# Patient Record
Sex: Male | Born: 1950 | Race: White | Hispanic: No | Marital: Single | Smoking: Current every day smoker
Health system: Southern US, Community
[De-identification: ages and names within clinical notes are randomized; demographics above are authoritative.]

## PROBLEM LIST (undated history)

## (undated) DIAGNOSIS — Z720 Tobacco use: Secondary | ICD-10-CM

## (undated) DIAGNOSIS — I639 Cerebral infarction, unspecified: Secondary | ICD-10-CM

## (undated) DIAGNOSIS — F1010 Alcohol abuse, uncomplicated: Secondary | ICD-10-CM

## (undated) DIAGNOSIS — E785 Hyperlipidemia, unspecified: Secondary | ICD-10-CM

## (undated) DIAGNOSIS — I10 Essential (primary) hypertension: Secondary | ICD-10-CM

## (undated) DIAGNOSIS — K6289 Other specified diseases of anus and rectum: Secondary | ICD-10-CM

## (undated) DIAGNOSIS — N529 Male erectile dysfunction, unspecified: Secondary | ICD-10-CM

## (undated) DIAGNOSIS — D179 Benign lipomatous neoplasm, unspecified: Secondary | ICD-10-CM

## (undated) DIAGNOSIS — N400 Enlarged prostate without lower urinary tract symptoms: Secondary | ICD-10-CM

## (undated) DIAGNOSIS — N342 Other urethritis: Secondary | ICD-10-CM

## (undated) HISTORY — DX: Male erectile dysfunction, unspecified: N52.9

## (undated) HISTORY — DX: Benign lipomatous neoplasm, unspecified: D17.9

## (undated) HISTORY — PX: OTHER SURGICAL HISTORY: SHX169

## (undated) HISTORY — DX: Benign prostatic hyperplasia without lower urinary tract symptoms: N40.0

## (undated) HISTORY — DX: Hyperlipidemia, unspecified: E78.5

## (undated) HISTORY — DX: Other urethritis: N34.2

## (undated) HISTORY — DX: Other specified diseases of anus and rectum: K62.89

## (undated) HISTORY — DX: Essential (primary) hypertension: I10

---

## 2011-12-08 ENCOUNTER — Ambulatory Visit: Payer: Self-pay | Admitting: Emergency Medicine

## 2013-06-24 ENCOUNTER — Emergency Department: Payer: Self-pay | Admitting: Emergency Medicine

## 2014-12-29 ENCOUNTER — Encounter: Payer: Self-pay | Admitting: Obstetrics and Gynecology

## 2014-12-29 ENCOUNTER — Ambulatory Visit (INDEPENDENT_AMBULATORY_CARE_PROVIDER_SITE_OTHER): Payer: BLUE CROSS/BLUE SHIELD | Admitting: Obstetrics and Gynecology

## 2014-12-29 VITALS — BP 108/75 | HR 86 | Resp 18 | Ht 70.0 in | Wt 218.6 lb

## 2014-12-29 DIAGNOSIS — N400 Enlarged prostate without lower urinary tract symptoms: Secondary | ICD-10-CM

## 2014-12-29 DIAGNOSIS — N529 Male erectile dysfunction, unspecified: Secondary | ICD-10-CM

## 2014-12-29 MED ORDER — ALPROSTADIL (VASODILATOR) 10 MCG IC KIT: 10.0000 ug | PACK | INTRACAVERNOUS | Status: DC | PRN

## 2014-12-29 MED ORDER — VARDENAFIL HCL 20 MG PO TABS: 20.0000 mg | ORAL_TABLET | Freq: Every day | ORAL | Status: DC | PRN

## 2014-12-29 NOTE — Progress Notes (Signed)
12/29/2014 3:27 PM   Marc Fischer [birth year] [id number]  Referring provider: No referring provider defined for this encounter.  Chief Complaint  Patient presents with  . Establish Care    HPI: Patient is a 64 year old male with a history of BPH and erectile dysfunction presenting today for his yearly follow-up appointment.  He presents today requesting refills on caverject and Levitra. He denies any penile pain or curvature.  No urinary complaints including dysuria, hesitancy, or hematuria.He reports difficulty achieving erections with all sexual encounters for many years.  Patient reports that his prostate cancer screening is performed by his PCP at [facility].  He denies any cardiac issues. Does not take any nitrate containing medications.  He is an every day smoker.   PMH: Past Medical History  Diagnosis Date  . Rectal pain   . Lipoma   . ED (erectile dysfunction)   . BPH (benign prostatic hyperplasia)   . HTN (hypertension)   . Urethritis   . HLD (hyperlipidemia)     Surgical History: No past surgical history on file.  Home Medications:    Medication List       This list is accurate as of: 12/29/14  3:27 PM.  Always use your most recent med list.               alprostadil 10 MCG injection  Commonly known as:  EDEX  10 mcg by Intracavitary route as needed for erectile dysfunction. use no more than 3 times per week     benazepril-hydrochlorthiazide 20-12.5 MG tablet  Commonly known as:  LOTENSIN HCT  Take 1 tablet by mouth daily.     gabapentin 100 MG capsule  Commonly known as:  NEURONTIN  Take 100 mg by mouth at bedtime.     omeprazole 20 MG capsule  Commonly known as:  PRILOSEC  Take 20 mg by mouth daily.     temazepam 15 MG capsule  Commonly known as:  RESTORIL  Take 15 mg by mouth at bedtime as needed. for sleep     vardenafil 20 MG tablet  Commonly known as:  LEVITRA  Take 1 tablet (20 mg total) by mouth daily as needed for erectile  dysfunction.        Allergies:  Allergies  Allergen Reactions  . Penicillins     Family History: No family history on file.  Social History:  reports that he has been smoking.  He does not have any smokeless tobacco history on file. He reports that he drinks alcohol. His drug history is not on file.  ROS: UROLOGY Frequent Urination?: No Hard to postpone urination?: No Burning/pain with urination?: No Get up at night to urinate?: No Leakage of urine?: No Urine stream starts and stops?: No Trouble starting stream?: No Do you have to strain to urinate?: No Blood in urine?: No Urinary tract infection?: No Sexually transmitted disease?: No Injury to kidneys or bladder?: No Painful intercourse?: No Weak stream?: No Erection problems?: No Penile pain?: No  Gastrointestinal Nausea?: No Vomiting?: No Indigestion/heartburn?: No Diarrhea?: No Constipation?: No  Constitutional Fever: No Night sweats?: No Weight loss?: No Fatigue?: No  Skin Skin rash/lesions?: No Itching?: No  Eyes Blurred vision?: No Double vision?: No  Ears/Nose/Throat Sore throat?: No Sinus problems?: No  Hematologic/Lymphatic Swollen glands?: No Easy bruising?: No  Cardiovascular Leg swelling?: No Chest pain?: No  Respiratory Cough?: No Shortness of breath?: No  Endocrine Excessive thirst?: No  Musculoskeletal Back pain?: No Joint pain?: No  Neurological Headaches?: No Dizziness?: No  Psychologic Depression?: No Anxiety?: No  Physical Exam: BP 108/75 mmHg  Pulse 86  Resp 18  Ht 5\' 10"  (1.778 m)  Wt 218 lb 9.6 oz (99.156 kg)  BMI 31.37 kg/m2  Constitutional:  Alert and oriented, No acute distress. HEENT: [place] AT, moist mucus membranes.  Trachea midline, no masses. Cardiovascular: No clubbing, cyanosis, or edema. Respiratory: Normal respiratory effort, no increased work of breathing. Skin: No rashes, bruises or suspicious lesions. Lymph: No cervical  adenopathy. Neurologic: Grossly intact, no focal deficits, moving all 4 extremities. Psychiatric: Normal mood and affect.  Laboratory Data:   Urinalysis No results found for: COLORURINE, APPEARANCEUR, LABSPEC, PHURINE, GLUCOSEU, HGBUR, BILIRUBINUR, KETONESUR, PROTEINUR, UROBILINOGEN, NITRITE, LEUKOCYTESUR  Pertinent Imaging:   Assessment & Plan:    1. ED- Patient presents for his yearly follow-up today. He states that he has difficulty achieving erections with all sexual encounters for many years now. He has had good success with Levitra and Caverject injections in the past and tolerated the medications well.  Medications refilled today and precautions reviewed.  2. BPH- Patient has a documented history of BPH.  He states that his PCP has been performing his yearly prostate cancer screening.  He denies LUTS.  Will discuss treatment should patient become symptomatic.  There are no diagnoses linked to this encounter.  Return in about 1 year (around 12/29/2015).  These notes generated with voice recognition software. I apologize for typographical errors.  Herbert Moors, [facility] [address] [place],  [phone]

## 2015-04-25 ENCOUNTER — Other Ambulatory Visit: Payer: Self-pay | Admitting: Urology

## 2015-05-03 ENCOUNTER — Telehealth: Payer: Self-pay | Admitting: Obstetrics and Gynecology

## 2015-05-03 NOTE — Telephone Encounter (Signed)
Pt had prescription for injectable erectile disfunction drug.  He was told it was discontinued and wants to know if there is a replacement.  Please give pt a call.

## 2015-05-07 NOTE — Telephone Encounter (Signed)
Pt was wondering if someone could call him back in regards to his ED medication being d/c'd. Pt was told that we would re-route the msg to [facility] for review and that someone would be in touch as soon as possible. Pt gave verbal ok that if he couldn't pick up it would be ok to leave a detailed msg on vm. Please advise.

## 2015-05-07 NOTE — Telephone Encounter (Signed)
Left patient mess to call/SW °

## 2015-05-09 NOTE — Telephone Encounter (Signed)
Pt is wanting to know what other medications could be given in replacement of edex/caverjet. [facility] told pt medication is no longer available.

## 2015-05-10 NOTE — Telephone Encounter (Signed)
Trimix would be an option.  He would have to schedule a test dose.  Is he interested in trying the Trimix?

## 2015-05-10 NOTE — Telephone Encounter (Signed)
[facility]. Pt made aware.

## 2015-05-15 ENCOUNTER — Ambulatory Visit: Payer: Self-pay | Admitting: Urology

## 2015-05-17 ENCOUNTER — Ambulatory Visit (INDEPENDENT_AMBULATORY_CARE_PROVIDER_SITE_OTHER): Payer: BLUE CROSS/BLUE SHIELD | Admitting: Urology

## 2015-05-17 ENCOUNTER — Telehealth: Payer: Self-pay | Admitting: Urology

## 2015-05-17 ENCOUNTER — Encounter: Payer: Self-pay | Admitting: Urology

## 2015-05-17 VITALS — BP 119/79 | HR 76 | Ht 70.0 in | Wt 211.2 lb

## 2015-05-17 DIAGNOSIS — N529 Male erectile dysfunction, unspecified: Secondary | ICD-10-CM | POA: Diagnosis not present

## 2015-05-17 DIAGNOSIS — N400 Enlarged prostate without lower urinary tract symptoms: Secondary | ICD-10-CM | POA: Diagnosis not present

## 2015-05-17 NOTE — Telephone Encounter (Signed)
Please call [facility] and ask them how much Trimix they gave him in the syringe and then give him refills of 5, 1.0 mL syringes with 3 refills.

## 2015-05-17 NOTE — Progress Notes (Signed)
8:53 PM   Marc Fischer 03/23/1950 [id number]  Referring provider: No referring provider defined for this encounter.  Chief Complaint  Patient presents with  . Erectile Dysfunction    Trimix injection training    HPI: Patient is 65 year old Caucasian male with erectile dysfunction who presents today to discuss Trimix injection therapy.  Patient injected the tri-mix prior to his office visit today.  He states he is achieving a good erection.  He states he injected the entire contents of the syringe.  He was unaware of the amount of medication he injected.    He has used other intracavernosal medication in the past and feels comfortable with the tri-mix.  He is not complaining of pain or curvature with erections.  He states his primary care physician follows him with his PSAs and prostate exams.    PMH: Past Medical History  Diagnosis Date  . Rectal pain   . Lipoma   . ED (erectile dysfunction)   . BPH (benign prostatic hyperplasia)   . HTN (hypertension)   . Urethritis   . HLD (hyperlipidemia)     Surgical History: Past Surgical History  Procedure Laterality Date  . None      Home Medications:    Medication List       This list is accurate as of: 05/17/15 11:59 PM.  Always use your most recent med list.               atorvastatin 20 MG tablet  Commonly known as:  LIPITOR  Take 20 mg by mouth daily.     benazepril-hydrochlorthiazide 20-12.5 MG tablet  Commonly known as:  LOTENSIN HCT  Take 1 tablet by mouth daily.     EDEX 10 MCG injection  Generic drug:  alprostadil  USE AS DIRECTED AS NEEDED     gabapentin 100 MG capsule  Commonly known as:  NEURONTIN  Take 100 mg by mouth at bedtime.     metoprolol succinate 50 MG 24 hr tablet  Commonly known as:  TOPROL-XL  Take 50 mg by mouth daily. Take with or immediately following a meal.     NON FORMULARY  Trimix injection     omeprazole 20 MG capsule  Commonly known as:  PRILOSEC  Take 20 mg by  mouth daily.     temazepam 15 MG capsule  Commonly known as:  RESTORIL  Take 15 mg by mouth at bedtime as needed. Reported on 05/17/2015     vardenafil 20 MG tablet  Commonly known as:  LEVITRA  Take 1 tablet (20 mg total) by mouth daily as needed for erectile dysfunction.        Allergies:  Allergies  Allergen Reactions  . Penicillins     Family History: Family History  Problem Relation Age of Onset  . Kidney disease Neg Hx   . Prostate cancer Neg Hx     Social History:  reports that he has been smoking.  He does not have any smokeless tobacco history on file. He reports that he drinks alcohol. He reports that he does not use illicit drugs.  ROS: UROLOGY Frequent Urination?: No Hard to postpone urination?: No Burning/pain with urination?: No Get up at night to urinate?: No Leakage of urine?: No Urine stream starts and stops?: No Trouble starting stream?: No Do you have to strain to urinate?: No Blood in urine?: No Urinary tract infection?: No Sexually transmitted disease?: No Injury to kidneys or bladder?: No Painful intercourse?: No Weak stream?: No  Erection problems?: Yes Penile pain?: No  Gastrointestinal Nausea?: No Vomiting?: No Indigestion/heartburn?: No Diarrhea?: No Constipation?: No  Constitutional Fever: No Night sweats?: No Weight loss?: No Fatigue?: No  Skin Skin rash/lesions?: No Itching?: No  Eyes Blurred vision?: No Double vision?: No  Ears/Nose/Throat Sore throat?: No Sinus problems?: No  Hematologic/Lymphatic Swollen glands?: No Easy bruising?: No  Cardiovascular Leg swelling?: No Chest pain?: No  Respiratory Cough?: No Shortness of breath?: No  Endocrine Excessive thirst?: No  Musculoskeletal Back pain?: No Joint pain?: No  Neurological Headaches?: No Dizziness?: No  Psychologic Depression?: No Anxiety?: No  Physical Exam: BP 119/79 mmHg  Pulse 76  Ht 5\' 10"  (1.778 m)  Wt 211 lb 3.2 oz (95.8 kg)   BMI 30.30 kg/m2  Constitutional:  Alert and oriented, No acute distress. HEENT: [place] AT, moist mucus membranes.  Trachea midline, no masses. Cardiovascular: No clubbing, cyanosis, or edema. Respiratory: Normal respiratory effort, no increased work of breathing. Skin: No rashes, bruises or suspicious lesions. Lymph: No cervical adenopathy. Neurologic: Grossly intact, no focal deficits, moving all 4 extremities. Psychiatric: Normal mood and affect.   Assessment & Plan:    1. ED- patient is comfortable with the tri-mix injections  I reminded him of the symptoms of priapism and he is to contact the office if he experiences erection greater than 4 hours or pain with the erections.   2. BPH- Patient has a documented history of BPH.  He states that his PCP has been performing his yearly prostate cancer screening.  He denies LUTS.  Will discuss treatment should patient become symptomatic.  Return in about 8 months (around 01/17/2016) for IPSS, SHIM and exam.  These notes generated with voice recognition software. I apologize for typographical errors.  Zara Council, [facility] [address] [place], [place] [address] [phone]

## 2015-05-21 NOTE — Telephone Encounter (Signed)
Medication called into [facility].  

## 2016-01-17 ENCOUNTER — Encounter: Payer: BLUE CROSS/BLUE SHIELD | Admitting: Urology

## 2016-01-17 ENCOUNTER — Telehealth: Payer: Self-pay | Admitting: Urology

## 2016-01-17 ENCOUNTER — Encounter: Payer: Self-pay | Admitting: Urology

## 2016-01-17 ENCOUNTER — Ambulatory Visit (INDEPENDENT_AMBULATORY_CARE_PROVIDER_SITE_OTHER): Payer: BLUE CROSS/BLUE SHIELD | Admitting: Urology

## 2016-01-17 VITALS — BP 122/79 | HR 80 | Ht 70.0 in | Wt 209.0 lb

## 2016-01-17 VITALS — BP 122/79 | HR 80 | Ht 70.0 in | Wt 209.3 lb

## 2016-01-17 DIAGNOSIS — N529 Male erectile dysfunction, unspecified: Secondary | ICD-10-CM | POA: Diagnosis not present

## 2016-01-17 DIAGNOSIS — N400 Enlarged prostate without lower urinary tract symptoms: Secondary | ICD-10-CM | POA: Diagnosis not present

## 2016-01-17 NOTE — Telephone Encounter (Signed)
Would you please call in Trimix (30 mg PAPA, 1 mL phen, 50 mg prostaglandin) for the patient to [facility]?

## 2016-01-17 NOTE — Progress Notes (Signed)
11:28 AM   Marc Fischer 1950/09/10 [id number]  Referring provider: Jodi Marble, MD [address] [place], [place] [address]  Chief Complaint  Patient presents with  . Benign Prostatic Hypertrophy    HPI: Patient is 65 year old Caucasian male with erectile dysfunction and BPH with LUTS who presents today  for a one year follow up.    Erectile dysfunction His SHIM score is 5, which is severe.   He has been having difficulty with erections for several years.   His major complaint is lack of firmness in his erections.  His libido is preserved.   His risk factors for ED are age, BPH, HTN, alcohol abuse and blood pressure medications.   He denies any painful erections or curvatures with his erections.   He is currently using Trimix, but he feels it is not as effective as it has been in the past.       [place] Name 01/17/16 1033 01/17/16 1101       SHIM: Over the last 6 months:   How do you rate your confidence that you could get and keep an erection? Very Low Very Low    When you had erections with sexual stimulation, how often were your erections hard enough for penetration (entering your partner)? Almost Never or Never Almost Never or Never    During sexual intercourse, how often were you able to maintain your erection after you had penetrated (entered) your partner? Extremely Difficult Extremely Difficult    During sexual intercourse, how difficult was it to maintain your erection to completion of intercourse? Extremely Difficult Extremely Difficult    When you attempted sexual intercourse, how often was it satisfactory for you? Extremely Difficult Extremely Difficult      SHIM Total Score   SHIM 5 5       Score: 1-7 Severe ED 8-11 Moderate ED 12-16 Mild-Moderate ED 17-21 Mild ED 22-25 No ED  BPH His IPSS score today is 0, which is no lower urinary tract symptomatology.  He is delighted with his quality life due to his urinary symptoms.  His has no complaints  today.  He denies any dysuria, hematuria or suprapubic pain.   He also denies any recent fevers, chills, nausea or vomiting.   He does not have a family history of PCa.     IPSS    Row Name 01/17/16 1000 01/17/16 1100       International Prostate Symptom Score   How often have you had the sensation of not emptying your bladder? Not at All Not at All    How often have you had to urinate less than every two hours? Not at All Not at All    How often have you found you stopped and started again several times when you urinated? Not at All Not at All    How often have you found it difficult to postpone urination? Not at All Not at All    How often have you had a weak urinary stream? Not at All Not at All    How often have you had to strain to start urination? Not at All Not at All    How many times did you typically get up at night to urinate? None None    Total IPSS Score 0 0      Quality of Life due to urinary symptoms   If you were to spend the rest of your life with your urinary condition just the  way it is now how would you feel about that? Delighted Delighted       Score:  1-7 Mild 8-19 Moderate 20-35 Severe   PMH: Past Medical History:  Diagnosis Date  . BPH (benign prostatic hyperplasia)   . ED (erectile dysfunction)   . HLD (hyperlipidemia)   . HTN (hypertension)   . Lipoma   . Rectal pain   . Urethritis     Surgical History: Past Surgical History:  Procedure Laterality Date  . none      Home Medications:    Medication List       Accurate as of 01/17/16 11:28 AM. Always use your most recent med list.          atorvastatin 20 MG tablet Commonly known as:  LIPITOR Take 20 mg by mouth daily.   benazepril-hydrochlorthiazide 20-12.5 MG tablet Commonly known as:  LOTENSIN HCT Take 1 tablet by mouth daily.   EDEX 10 MCG injection Generic drug:  alprostadil USE AS DIRECTED AS NEEDED   gabapentin 100 MG capsule Commonly known as:  NEURONTIN Take 100 mg  by mouth at bedtime.   metoprolol succinate 50 MG 24 hr tablet Commonly known as:  TOPROL-XL Take 50 mg by mouth daily. Take with or immediately following a meal.   NON FORMULARY Trimix injection   omeprazole 20 MG capsule Commonly known as:  PRILOSEC Take 20 mg by mouth daily.   temazepam 15 MG capsule Commonly known as:  RESTORIL Take 15 mg by mouth at bedtime as needed. Reported on 05/17/2015   vardenafil 20 MG tablet Commonly known as:  LEVITRA Take 1 tablet (20 mg total) by mouth daily as needed for erectile dysfunction.       Allergies:  Allergies  Allergen Reactions  . Penicillins     Family History: Family History  Problem Relation Age of Onset  . Kidney disease Neg Hx   . Prostate cancer Neg Hx     Social History:  reports that he has been smoking.  He has never used smokeless tobacco. He reports that he drinks alcohol. He reports that he does not use drugs.  ROS: UROLOGY Frequent Urination?: No Hard to postpone urination?: No Burning/pain with urination?: No Get up at night to urinate?: No Leakage of urine?: No Urine stream starts and stops?: No Trouble starting stream?: No Do you have to strain to urinate?: No Blood in urine?: No Urinary tract infection?: No Sexually transmitted disease?: No Injury to kidneys or bladder?: No Painful intercourse?: No Weak stream?: No Erection problems?: No Penile pain?: No  Gastrointestinal Nausea?: No Vomiting?: No Indigestion/heartburn?: No Diarrhea?: No Constipation?: No  Constitutional Fever: No Night sweats?: No Weight loss?: No Fatigue?: No  Skin Skin rash/lesions?: No Itching?: No  Eyes Blurred vision?: No Double vision?: No  Ears/Nose/Throat Sore throat?: No Sinus problems?: No  Hematologic/Lymphatic Swollen glands?: No Easy bruising?: No  Cardiovascular Leg swelling?: No Chest pain?: No  Respiratory Cough?: No Shortness of breath?: No  Endocrine Excessive thirst?:  No  Musculoskeletal Back pain?: No Joint pain?: No  Neurological Headaches?: No Dizziness?: No  Psychologic Depression?: No Anxiety?: No  Physical Exam: BP 122/79   Pulse 80   Ht 5\' 10"  (1.778 m)   Wt 209 lb (94.8 kg)   BMI 29.99 kg/m   Constitutional: Well nourished. Alert and oriented, No acute distress. HEENT: [place] AT, moist mucus membranes. Trachea midline, no masses. Cardiovascular: No clubbing, cyanosis, or edema. Respiratory: Normal respiratory effort, no increased work  of breathing. GI: Abdomen is soft, non tender, non distended, no abdominal masses. Liver and spleen not palpable.  No hernias appreciated.  Stool sample for occult testing is not indicated.   GU: No CVA tenderness.  No bladder fullness or masses.  Patient with circumcised phallus.  Urethral meatus is patent.  No penile discharge. No penile lesions or rashes. Scrotum without lesions, cysts, rashes and/or edema.  Testicles are located scrotally bilaterally. No masses are appreciated in the testicles. Left and right epididymis are normal. Rectal: Patient with  normal sphincter tone. Anus and perineum without scarring or rashes. No rectal masses are appreciated. Prostate is approximately 55 grams, no nodules are appreciated. Seminal vesicles are normal. Skin: No rashes, bruises or suspicious lesions. Lymph: No cervical or inguinal adenopathy. Neurologic: Grossly intact, no focal deficits, moving all 4 extremities. Psychiatric: Normal mood and affect.   Assessment & Plan:    1. ED- patient is comfortable with the tri-mix injections  We will increase his dose.  I reminded him of the symptoms of priapism and he is to contact the office if he experiences erection greater than 4 hours or pain with the erections.   RTC in one year for SHIM and exam  2. BPH- IPSS 0/0.   Will discuss treatment should patient become symptomatic.  PSA drawn today.  He will RTC in one year of IPSS, PSA and exam.    Return in about 1  year (around 01/16/2017) for IPSS, SHIM, PSA and exam.  These notes generated with voice recognition software. I apologize for typographical errors.  Zara Council, [facility] [address], [place], [place] [address] [phone]

## 2016-01-17 NOTE — Telephone Encounter (Signed)
[facility].

## 2016-01-18 LAB — PSA: PROSTATE SPECIFIC AG, SERUM: 1.9 ng/mL (ref 0.0–4.0)

## 2016-01-21 ENCOUNTER — Telehealth: Payer: Self-pay

## 2016-01-21 NOTE — Telephone Encounter (Signed)
Spoke with pt in reference to PSA results. Pt voiced understanding.  

## 2016-01-21 NOTE — Telephone Encounter (Signed)
-----   Message from Nori Riis, PA-C sent at 01/18/2016  8:08 AM EST ----- Please notify the patient that his PSA is normal and we will see him next year.

## 2016-01-28 ENCOUNTER — Ambulatory Visit: Payer: BLUE CROSS/BLUE SHIELD | Admitting: Urology

## 2016-04-03 ENCOUNTER — Other Ambulatory Visit: Payer: Self-pay

## 2016-05-21 NOTE — Progress Notes (Signed)
This encounter was created in error - please disregard.

## 2017-01-14 NOTE — Progress Notes (Signed)
10:15 AM   Marc Fischer [birth year] [id number]  Referring provider: Jodi Marble, MD [place], [place] [address]  Chief Complaint  Patient presents with  . Erectile Dysfunction    HPI: Patient is 66 year old Caucasian male with erectile dysfunction and BPH with LUTS who presents today  for a one year follow up.    Erectile dysfunction His SHIM score is 5, which is severe ED.  His previous SHIM score was 5.   He has been having difficulty with erections for several years.   His major complaint is lack of firmness in his erections.  His libido is preserved.   His risk factors for ED are age, BPH, HTN, alcohol abuse and blood pressure medications.   He denies any painful erections or curvatures with his erections.   He is currently using Trimix, but he feels it is not as effective as it has been in the past.   [place] Name 01/15/17 0950         SHIM: Over the last 6 months:   How do you rate your confidence that you could get and keep an erection?  Very Low     When you had erections with sexual stimulation, how often were your erections hard enough for penetration (entering your partner)?  No Sexual Activity     During sexual intercourse, how often were you able to maintain your erection after you had penetrated (entered) your partner?  No Sexual Activity     During sexual intercourse, how difficult was it to maintain your erection to completion of intercourse?  Did not attempt intercourse     When you attempted sexual intercourse, how often was it satisfactory for you?  Did not attempt intercourse       SHIM Total Score   SHIM  1        Score: 1-7 Severe ED 8-11 Moderate ED 12-16 Mild-Moderate ED 17-21 Mild ED 22-25 No ED  BPH His IPSS score today is 0, which is no lower urinary tract symptomatology.  He is delighted with his quality life due to his urinary symptoms.  His I PSS score was 0/0.  His has no complaints today.  He denies any dysuria,  hematuria or suprapubic pain.   He also denies any recent fevers, chills, nausea or vomiting.   He does not have a family history of PCa. IPSS    [place] Name 01/15/17 0900         International Prostate Symptom Score   How often have you had the sensation of not emptying your bladder?  Not at All     How often have you had to urinate less than every two hours?  Not at All     How often have you found you stopped and started again several times when you urinated?  Not at All     How often have you found it difficult to postpone urination?  Not at All     How often have you had a weak urinary stream?  Not at All     How often have you had to strain to start urination?  Not at All     How many times did you typically get up at night to urinate?  None     Total IPSS Score  0       Quality of Life due to urinary symptoms   If you were to spend the rest of your life with  your urinary condition just the way it is now how would you feel about that?  Delighted        Score:  1-7 Mild 8-19 Moderate 20-35 Severe   PMH: Past Medical History:  Diagnosis Date  . BPH (benign prostatic hyperplasia)   . ED (erectile dysfunction)   . HLD (hyperlipidemia)   . HTN (hypertension)   . Lipoma   . Rectal pain   . Urethritis     Surgical History: Past Surgical History:  Procedure Laterality Date  . none      Home Medications:  Allergies as of 01/15/2017      Reactions   Penicillins       Medication List        Accurate as of 01/15/17 10:15 AM. Always use your most recent med list.          atorvastatin 20 MG tablet Commonly known as:  LIPITOR Take 20 mg by mouth daily.   EDEX 10 MCG injection Generic drug:  alprostadil USE AS DIRECTED AS NEEDED   gabapentin 100 MG capsule Commonly known as:  NEURONTIN Take 100 mg by mouth at bedtime.   lisinopril 20 MG tablet Commonly known as:  PRINIVIL,ZESTRIL Take 20 mg by mouth daily.   metoprolol succinate 50 MG 24 hr  tablet Commonly known as:  TOPROL-XL Take 50 mg by mouth daily. Take with or immediately following a meal.   NON FORMULARY Trimix injection   omeprazole 20 MG capsule Commonly known as:  PRILOSEC Take 20 mg by mouth daily.   temazepam 15 MG capsule Commonly known as:  RESTORIL Take 15 mg by mouth at bedtime as needed. Reported on 05/17/2015   vardenafil 20 MG tablet Commonly known as:  LEVITRA Take 1 tablet (20 mg total) by mouth daily as needed for erectile dysfunction.       Allergies:  Allergies  Allergen Reactions  . Penicillins     Family History: Family History  Problem Relation Age of Onset  . Kidney disease Neg Hx   . Prostate cancer Neg Hx     Social History:  reports that he has been smoking.  he has never used smokeless tobacco. He reports that he drinks alcohol. He reports that he does not use drugs.  ROS: UROLOGY Frequent Urination?: No Hard to postpone urination?: No Burning/pain with urination?: No Get up at night to urinate?: No Leakage of urine?: No Urine stream starts and stops?: No Trouble starting stream?: No Do you have to strain to urinate?: No Blood in urine?: No Urinary tract infection?: No Sexually transmitted disease?: No Injury to kidneys or bladder?: No Painful intercourse?: No Weak stream?: No Erection problems?: No Penile pain?: No  Gastrointestinal Nausea?: No Vomiting?: No Indigestion/heartburn?: No Diarrhea?: No Constipation?: No  Constitutional Fever: No Night sweats?: No Weight loss?: No Fatigue?: No  Skin Skin rash/lesions?: No Itching?: No  Eyes Blurred vision?: No Double vision?: No  Ears/Nose/Throat Sore throat?: No Sinus problems?: No  Hematologic/Lymphatic Swollen glands?: No Easy bruising?: No  Cardiovascular Leg swelling?: No Chest pain?: No  Respiratory Cough?: No Shortness of breath?: No  Endocrine Excessive thirst?: No  Musculoskeletal Back pain?: No Joint pain?:  No  Neurological Headaches?: No Dizziness?: No  Psychologic Depression?: No Anxiety?: No  Physical Exam: BP 133/82   Pulse 91   Ht 5\' 10"  (1.778 m)   Wt 201 lb 8 oz (91.4 kg)   BMI 28.91 kg/m   Constitutional: Well nourished. Alert and oriented, No  acute distress. HEENT: [place] AT, moist mucus membranes. Trachea midline, no masses. Cardiovascular: No clubbing, cyanosis, or edema. Respiratory: Normal respiratory effort, no increased work of breathing. GI: Abdomen is soft, non tender, non distended, no abdominal masses. Liver and spleen not palpable.  No hernias appreciated.  Stool sample for occult testing is not indicated.   GU: No CVA tenderness.  No bladder fullness or masses.  He refused his genital exam.   Rectal: He refused rectal exam.   Skin: No rashes, bruises or suspicious lesions. Lymph: No cervical or inguinal adenopathy. Neurologic: Grossly intact, no focal deficits, moving all 4 extremities. Psychiatric: Normal mood and affect.   Assessment & Plan:    1. ED- patient is comfortable with the tri-mix injections  We will increase his dose.  I reminded him of the symptoms of priapism and he is to contact the office if he experiences erection greater than 4 hours or pain with the erections.   RTC in one year for SHIM and exam  2. BPH- IPSS 0/0.   Will discuss treatment should patient become symptomatic.  PSA drawn today.  He will RTC in one year of IPSS, PSA and exam.    Return in about 1 year (around 01/15/2018) for IPSS, SHIM, PSA and exam.  These notes generated with voice recognition software. I apologize for typographical errors.  Zara Council, [facility] [address] [place], [place] [address] [phone]

## 2017-01-15 ENCOUNTER — Telehealth: Payer: Self-pay | Admitting: Urology

## 2017-01-15 ENCOUNTER — Ambulatory Visit (INDEPENDENT_AMBULATORY_CARE_PROVIDER_SITE_OTHER): Payer: Medicare Other | Admitting: Urology

## 2017-01-15 ENCOUNTER — Encounter: Payer: Self-pay | Admitting: Urology

## 2017-01-15 VITALS — BP 133/82 | HR 91 | Ht 70.0 in | Wt 201.5 lb

## 2017-01-15 DIAGNOSIS — N529 Male erectile dysfunction, unspecified: Secondary | ICD-10-CM

## 2017-01-15 DIAGNOSIS — N400 Enlarged prostate without lower urinary tract symptoms: Secondary | ICD-10-CM

## 2017-01-15 NOTE — Telephone Encounter (Signed)
Would you call in the Trimix for the patient?  Would you please call in Trimix (30 mg PAPA, 1 mL phen, 50 mg prostaglandin) for the patient?  Would you ask [facility] if they can change the ingredients?

## 2017-01-16 ENCOUNTER — Telehealth: Payer: Self-pay

## 2017-01-16 LAB — PSA: Prostate Specific Ag, Serum: 1.7 ng/mL (ref 0.0–4.0)

## 2017-01-16 NOTE — Telephone Encounter (Signed)
Will send a letter

## 2017-01-16 NOTE — Telephone Encounter (Signed)
-----   Message from Nori Riis, PA-C sent at 01/16/2017  7:49 AM EST ----- Please let Marc Fischer know that his PSA is stable.

## 2017-02-10 ENCOUNTER — Observation Stay
Admission: EM | Admit: 2017-02-10 | Discharge: 2017-02-10 | Disposition: A | Discharge status: Home / Self Care | Payer: Medicare Other | Attending: Internal Medicine | Admitting: Internal Medicine

## 2017-02-10 ENCOUNTER — Other Ambulatory Visit: Payer: Self-pay

## 2017-02-10 ENCOUNTER — Emergency Department: Payer: Medicare Other

## 2017-02-10 ENCOUNTER — Encounter: Payer: Self-pay | Admitting: Emergency Medicine

## 2017-02-10 DIAGNOSIS — E876 Hypokalemia: Secondary | ICD-10-CM | POA: Insufficient documentation

## 2017-02-10 DIAGNOSIS — F17200 Nicotine dependence, unspecified, uncomplicated: Secondary | ICD-10-CM | POA: Insufficient documentation

## 2017-02-10 DIAGNOSIS — Z880 Allergy status to penicillin: Secondary | ICD-10-CM | POA: Insufficient documentation

## 2017-02-10 DIAGNOSIS — E785 Hyperlipidemia, unspecified: Secondary | ICD-10-CM | POA: Diagnosis not present

## 2017-02-10 DIAGNOSIS — E871 Hypo-osmolality and hyponatremia: Secondary | ICD-10-CM | POA: Insufficient documentation

## 2017-02-10 DIAGNOSIS — N400 Enlarged prostate without lower urinary tract symptoms: Secondary | ICD-10-CM | POA: Insufficient documentation

## 2017-02-10 DIAGNOSIS — Z79899 Other long term (current) drug therapy: Secondary | ICD-10-CM | POA: Diagnosis not present

## 2017-02-10 DIAGNOSIS — F1010 Alcohol abuse, uncomplicated: Secondary | ICD-10-CM | POA: Diagnosis not present

## 2017-02-10 DIAGNOSIS — I10 Essential (primary) hypertension: Secondary | ICD-10-CM | POA: Diagnosis not present

## 2017-02-10 DIAGNOSIS — Z7982 Long term (current) use of aspirin: Secondary | ICD-10-CM | POA: Diagnosis not present

## 2017-02-10 DIAGNOSIS — A419 Sepsis, unspecified organism: Secondary | ICD-10-CM | POA: Diagnosis not present

## 2017-02-10 LAB — COMPREHENSIVE METABOLIC PANEL
ALT: 15 U/L — ABNORMAL LOW (ref 17–63)
ANION GAP: 8 (ref 5–15)
AST: 44 U/L — ABNORMAL HIGH (ref 15–41)
Albumin: 4.2 g/dL (ref 3.5–5.0)
Alkaline Phosphatase: 100 U/L (ref 38–126)
BUN: 10 mg/dL (ref 6–20)
CHLORIDE: 100 mmol/L — AB (ref 101–111)
CO2: 24 mmol/L (ref 22–32)
Calcium: 9.3 mg/dL (ref 8.9–10.3)
Creatinine, Ser: 1.25 mg/dL — ABNORMAL HIGH (ref 0.61–1.24)
GFR, EST NON AFRICAN AMERICAN: 58 mL/min — AB (ref >60)
Glucose, Bld: 140 mg/dL — ABNORMAL HIGH (ref 65–99)
POTASSIUM: 3.2 mmol/L — AB (ref 3.5–5.1)
Sodium: 132 mmol/L — ABNORMAL LOW (ref 135–145)
Total Bilirubin: 0.9 mg/dL (ref 0.3–1.2)
Total Protein: 7.7 g/dL (ref 6.5–8.1)

## 2017-02-10 LAB — CBC WITH DIFFERENTIAL/PLATELET
Basophils Absolute: 0.1 10*3/uL (ref 0–0.1)
Basophils Relative: 1 %
Eosinophils Absolute: 0 10*3/uL (ref 0–0.7)
Eosinophils Relative: 0 %
HCT: 50.4 % (ref 40.0–52.0)
HEMOGLOBIN: 17.5 g/dL (ref 13.0–18.0)
LYMPHS ABS: 0.8 10*3/uL — AB (ref 1.0–3.6)
LYMPHS PCT: 8 %
MCH: 32.8 pg (ref 26.0–34.0)
MCHC: 34.7 g/dL (ref 32.0–36.0)
MCV: 94.6 fL (ref 80.0–100.0)
Monocytes Absolute: 0.8 10*3/uL (ref 0.2–1.0)
Monocytes Relative: 8 %
NEUTROS PCT: 83 %
Neutro Abs: 8.4 10*3/uL — ABNORMAL HIGH (ref 1.4–6.5)
Platelets: 232 10*3/uL (ref 150–440)
RBC: 5.33 MIL/uL (ref 4.40–5.90)
RDW: 13 % (ref 11.5–14.5)
WBC: 10.1 10*3/uL (ref 3.8–10.6)

## 2017-02-10 LAB — URINALYSIS, COMPLETE (UACMP) WITH MICROSCOPIC
BACTERIA UA: NONE SEEN
BILIRUBIN URINE: NEGATIVE
Glucose, UA: NEGATIVE mg/dL
Hgb urine dipstick: NEGATIVE
Ketones, ur: NEGATIVE mg/dL
LEUKOCYTES UA: NEGATIVE
Nitrite: NEGATIVE
PH: 6 (ref 5.0–8.0)
Protein, ur: NEGATIVE mg/dL
RBC / HPF: NONE SEEN RBC/hpf (ref 0–5)
SPECIFIC GRAVITY, URINE: 1.004 — AB (ref 1.005–1.030)
SQUAMOUS EPITHELIAL / LPF: NONE SEEN
WBC, UA: NONE SEEN WBC/hpf (ref 0–5)

## 2017-02-10 LAB — INFLUENZA PANEL BY PCR (TYPE A & B)
INFLAPCR: NEGATIVE
Influenza B By PCR: NEGATIVE

## 2017-02-10 LAB — LACTIC ACID, PLASMA: Lactic Acid, Venous: 1.4 mmol/L (ref 0.5–1.9)

## 2017-02-10 MED ORDER — FOLIC ACID 1 MG PO TABS: 1.0000 mg | ORAL_TABLET | Freq: Every day | ORAL | Status: DC

## 2017-02-10 MED ORDER — VANCOMYCIN HCL IN DEXTROSE 1-5 GM/200ML-% IV SOLN
1000.0000 mg | Freq: Once | INTRAVENOUS | Status: AC
  Administered 2017-02-10: 1000 mg via INTRAVENOUS
  Filled 2017-02-10: qty 200

## 2017-02-10 MED ORDER — LEVOFLOXACIN 750 MG PO TABS: 750.0000 mg | ORAL_TABLET | Freq: Every day | ORAL | 0 refills | Status: AC

## 2017-02-10 MED ORDER — LEVOFLOXACIN IN D5W 750 MG/150ML IV SOLN
750.0000 mg | Freq: Once | INTRAVENOUS | Status: AC
  Administered 2017-02-10: 750 mg via INTRAVENOUS
  Filled 2017-02-10: qty 150

## 2017-02-10 MED ORDER — ADULT MULTIVITAMIN W/MINERALS CH: 1.0000 | ORAL_TABLET | Freq: Every day | ORAL | Status: DC

## 2017-02-10 MED ORDER — THIAMINE HCL 100 MG/ML IJ SOLN: 100.0000 mg | Freq: Every day | INTRAMUSCULAR | Status: DC

## 2017-02-10 MED ORDER — SODIUM CHLORIDE 0.9 % IV BOLUS (SEPSIS)
1000.0000 mL | Freq: Once | INTRAVENOUS | Status: AC
  Administered 2017-02-10: 1000 mL via INTRAVENOUS

## 2017-02-10 MED ORDER — VITAMIN B-1 100 MG PO TABS: 100.0000 mg | ORAL_TABLET | Freq: Every day | ORAL | Status: DC

## 2017-02-10 MED ORDER — LORAZEPAM 1 MG PO TABS: 1.0000 mg | ORAL_TABLET | Freq: Four times a day (QID) | ORAL | Status: DC | PRN

## 2017-02-10 MED ORDER — IBUPROFEN 800 MG PO TABS
ORAL_TABLET | ORAL | Status: AC
  Administered 2017-02-10: 800 mg via ORAL
  Filled 2017-02-10: qty 1

## 2017-02-10 MED ORDER — IBUPROFEN 800 MG PO TABS
800.0000 mg | ORAL_TABLET | Freq: Once | ORAL | Status: AC
  Administered 2017-02-10: 800 mg via ORAL

## 2017-02-10 MED ORDER — POTASSIUM CHLORIDE IN NACL 20-0.9 MEQ/L-% IV SOLN: INTRAVENOUS | Status: DC

## 2017-02-10 MED ORDER — LORAZEPAM 2 MG/ML IJ SOLN: 1.0000 mg | Freq: Four times a day (QID) | INTRAMUSCULAR | Status: DC | PRN

## 2017-02-10 MED ORDER — NICOTINE 7 MG/24HR TD PT24
7.0000 mg | MEDICATED_PATCH | Freq: Every day | TRANSDERMAL | Status: DC
  Filled 2017-02-10: qty 1
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
## 2017-02-10 NOTE — Discharge Instructions (Signed)
It is really very important that she stay in the hospital.  It is possible that if you leave the antibiotics by mouth will not be strong enough you could get sick and die.  Since you are not going to stay please be sure to take the Levaquin 1 pill every day.  Remember if you begin getting pains in your arms and legs come in immediately.  Very rarely the Levaquin will cause a tendon to rupture.  Be sure to return if you feel sicker.  Follow-up with your doctor in the next day or 2.

## 2017-02-10 NOTE — ED Notes (Signed)
Pt reports taking 3-5 200mg  tylenol yesterday and 4-5 200mg  tylenol today.

## 2017-02-10 NOTE — ED Triage Notes (Addendum)
Since last week has cough and congestion.  Says he thinks he has pneumonia.  Has been drinking h2o and mucinex and other otc without relief.  Also under a lot of stress recently.

## 2017-02-10 NOTE — ED Notes (Addendum)
Pt left AMA. He understands what it might mean if he goes home rather than staying here. Pt stated he will come back here "if he needs to." Pt verbalized understanding of when to start taking abx prescription and how often to take it. Pt insisting on leaving AMA. This RN witnessed signature. Dr. Cinda Quest aware. 1C aware they will not be taking this patient again.

## 2017-02-10 NOTE — Progress Notes (Signed)
CODE SEPSIS - PHARMACY COMMUNICATION  **Broad Spectrum Antibiotics should be administered within 1 hour of Sepsis diagnosis**  Time Code Sepsis Called/Page Received: 12/25 1218  Antibiotics Ordered: Levaquin, Vancomycin  Time of 1st antibiotic administration: [id number]  Additional action taken by pharmacy:    If necessary, Name of Provider/Nurse Contacted:      Noralee Space ,PharmD Clinical Pharmacist  02/10/2017  1:03 PM

## 2017-02-10 NOTE — ED Notes (Signed)
Patient transported to XR. 

## 2017-02-10 NOTE — Discharge Summary (Signed)
DOA - 02/10/17 Date left hospital AGAINST MEDICAL ADVICE 12/25/ 2018   HPI  -Marc Fischer  is a 66 y.o. male with a known history of hypertension, hyperlipidemia, alcohol abuse, drinks beer on daily basis. Last drink was last night and other medical problems is presenting to the ED with a chief complaint of chills and cold from last night. Patient was spiking high fever 103 for and heat which made him come to the hospital today. Flu test is negative. Chest x-rays negative. Hospitalist team is called to admit the patient. Patient denies any chest pain or shortness of breath. Denies any abdominal pain. No other complaints. No sick contacts.   # Sepsis-unclear etiology Admitted to [facility] unit Patient met septic criteria with fever and tachycardia-probably viral etiology Blood cultures and urine culture and sensitivity done Flu test is negative Patient is started on empiric IV antibiotics levofloxacin and vancomycin; vancomycin can be discontinued if MRSA PCR test is negative IV fluids  #Hyponatremia and hypokalemia Replete with IV fluids with potassium supplements and recheck in a.m.  #Alcohol abuse CIWA Patient is on detox protocol  #Essential hypertension continue home medication metoprolol and lisinopril  #Hyponatremia continue statin  #Tobacco abuse disorder Consultation to quit smoking for 4-5 minutes. Patient verbalized understanding. Agreeable with nicotine patch.    On the same day as reported by the ED patient left hospital again is medical advice from the emergency department even before he got transferred to 1 c unit of the hospital

## 2017-02-10 NOTE — H&P (Signed)
[facility] NAME: Marc Fischer    MR#:  [id number]  DATE OF BIRTH:  [birth year]  DATE OF ADMISSION:  02/10/2017  PRIMARY CARE PHYSICIAN: Jodi Marble, MD   REQUESTING/REFERRING PHYSICIAN: Dr. Rip Harbour  CHIEF COMPLAINT:  Cough and fever  HISTORY OF PRESENT ILLNESS:  Marc Fischer  is a 66 y.o. male with a known history of hypertension, hyperlipidemia, alcohol abuse, drinks beer on daily basis. Last drink was last night and other medical problems is presenting to the ED with a chief complaint of chills and cold from last night. Patient was spiking high fever 103 for and heat which made him come to the hospital today. Flu test is negative. Chest x-rays negative. Hospitalist team is called to admit the patient. Patient denies any chest pain or shortness of breath. Denies any abdominal pain. No other complaints. No sick contacts.  PAST MEDICAL HISTORY:   Past Medical History:  Diagnosis Date  . BPH (benign prostatic hyperplasia)   . ED (erectile dysfunction)   . HLD (hyperlipidemia)   . HTN (hypertension)   . Lipoma   . Rectal pain   . Urethritis     PAST SURGICAL HISTOIRY:   Past Surgical History:  Procedure Laterality Date  . none      SOCIAL HISTORY:   Social History   Tobacco Use  . Smoking status: Heavy Tobacco Smoker  . Smokeless tobacco: Never Used  Substance Use Topics  . Alcohol use: Yes    Alcohol/week: 0.0 oz    FAMILY HISTORY:   Family History  Problem Relation Age of Onset  . Kidney disease Neg Hx   . Prostate cancer Neg Hx     DRUG ALLERGIES:   Allergies  Allergen Reactions  . Penicillins     Has patient had a PCN reaction causing immediate rash, facial/tongue/throat swelling, SOB or lightheadedness with hypotension: No Has patient had a PCN reaction causing severe rash involving mucus membranes or skin necrosis: No Has patient had a PCN reaction that required  hospitalization: No Has patient had a PCN reaction occurring within the last 10 years: No If all of the above answers are "NO", then may proceed with Cephalosporin use.     REVIEW OF SYSTEMS:  CONSTITUTIONAL:has  fever, fatigue or weakness.  EYES: No blurred or double vision.  EARS, NOSE, AND THROAT: No tinnitus or ear pain.  RESPIRATORY:reports  cough,  deniesshortness of breath, wheezing or hemoptysis.  CARDIOVASCULAR: No chest pain, orthopnea, edema.  GASTROINTESTINAL: No nausea, vomiting, diarrhea or abdominal pain.  GENITOURINARY: No dysuria, hematuria.  ENDOCRINE: No polyuria, nocturia,  HEMATOLOGY: No anemia, easy bruising or bleeding SKIN: No rash or lesion. MUSCULOSKELETAL: No joint pain or arthritis.   NEUROLOGIC: No tingling, numbness, weakness.  PSYCHIATRY: No anxiety or depression.   MEDICATIONS AT HOME:   Prior to Admission medications   Medication Sig Start Date End Date Taking? Authorizing Provider  aspirin 325 MG tablet Take 325-650 mg by mouth daily.   Yes [provider]  atorvastatin (LIPITOR) 20 MG tablet Take 20 mg by mouth daily.   Yes [provider]  gabapentin (NEURONTIN) 100 MG capsule Take 100 mg by mouth at bedtime. 10/13/14  Yes [provider]  lisinopril (PRINIVIL,ZESTRIL) 20 MG tablet Take 40 mg by mouth daily.    Yes [provider]  metoprolol succinate (TOPROL-XL) 50 MG 24 hr tablet Take 50 mg by mouth daily. Take with or immediately following  a meal.   Yes [provider]  omeprazole (PRILOSEC) 20 MG capsule Take 20 mg by mouth daily.   Yes [provider]  temazepam (RESTORIL) 15 MG capsule Take 15 mg by mouth at bedtime as needed for sleep.  11/27/14  Yes [provider]  EDEX 10 MCG injection USE AS DIRECTED AS NEEDED Patient not taking: Reported on 01/17/2016 04/30/15   Zara Council A, PA-C  levofloxacin (LEVAQUIN) 750 MG tablet Take 1 tablet (750 mg total) by mouth daily for 7  days. Start this medicine tomorrow 02/11/2017.  You had the first dose IV in the emergency room. 02/10/17 02/17/17  Nena Polio, MD  NON FORMULARY Trimix injection    [provider]  vardenafil (LEVITRA) 20 MG tablet Take 1 tablet (20 mg total) by mouth daily as needed for erectile dysfunction. Patient not taking: Reported on 01/15/2017 12/29/14   Roda Shutters, FNP      VITAL SIGNS:  Blood pressure 122/73, pulse 98, temperature 99.3 F (37.4 C), temperature source Oral, resp. rate 18, height '5\' 11"'$  (1.803 m), weight 92.5 kg (204 lb), SpO2 97 %.  PHYSICAL EXAMINATION:  GENERAL:  66 y.o.-year-old patient lying in the bed with no acute distress.  EYES: Pupils equal, round, reactive to light and accommodation. No scleral icterus. Extraocular muscles intact.  HEENT: Head atraumatic, normocephalic. Oropharynx and nasopharynx clear.  NECK:  Supple, no jugular venous distention. No thyroid enlargement, no tenderness.  LUNGS: Coarse breath sounds bilaterally, no wheezing, rales,rhonchi or crepitation. No use of accessory muscles of respiration.  CARDIOVASCULAR: S1, S2 normal. No murmurs, rubs, or gallops.  ABDOMEN: Soft, nontender, nondistended. Bowel sounds present. No organomegaly or mass.  EXTREMITIES: No pedal edema, cyanosis, or clubbing.  NEUROLOGIC: Cranial nerves II through XII are intact. Muscle strength 5/5 in all extremities. Sensation intact. Gait not checked.  PSYCHIATRIC: The patient is alert and oriented x 3.  SKIN: No obvious rash, lesion, or ulcer.   LABORATORY PANEL:   CBC Recent Labs  Lab 02/10/17 1156  WBC 10.1  HGB 17.5  HCT 50.4  PLT 232   ------------------------------------------------------------------------------------------------------------------  Chemistries  Recent Labs  Lab 02/10/17 1156  NA 132*  K 3.2*  CL 100*  CO2 24  GLUCOSE 140*  BUN 10  CREATININE 1.25*  CALCIUM 9.3  AST 44*  ALT 15*  ALKPHOS 100  BILITOT 0.9    ------------------------------------------------------------------------------------------------------------------  Cardiac Enzymes No results for input(s): TROPONINI in the last 168 hours. ------------------------------------------------------------------------------------------------------------------  RADIOLOGY:  Dg Chest 2 View  Result Date: 02/10/2017 CLINICAL DATA:  Cough, tachycardia EXAM: CHEST  2 VIEW COMPARISON:  None. FINDINGS: Heart and mediastinal contours are within normal limits. No focal opacities or effusions. No acute bony abnormality. Mild hyperinflation. IMPRESSION: Mild hyperinflation.  No active cardiopulmonary disease. Electronically Signed   By: Rolm Baptise M.D.   On: 02/10/2017 12:28    EKG:   Orders placed or performed during the hospital encounter of 02/10/17  . ED EKG  . ED EKG  . ED EKG 12-Lead  . ED EKG 12-Lead  . EKG 12-Lead  . EKG 12-Lead    IMPRESSION AND PLAN:   Marc Fischer  is a 66 y.o. male with a known history of hypertension, hyperlipidemia, alcohol abuse, drinks beer on daily basis. Last drink was last night and other medical problems is presenting to the ED with a chief complaint of chills and cold from last night. Patient was spiking high fever 103 for and heat  which made him come to the hospital today. Flu test is negative. Chest x-rays negative  # Sepsis-unclear etiology Admitted to [facility] unit Patient met septic criteria with fever and tachycardia-probably viral etiology Blood cultures and urine culture and sensitivity done Flu test is negative Patient is started on empiric IV antibiotics levofloxacin and vancomycin; vancomycin can be discontinued if MRSA PCR test is negative IV fluids  #Hyponatremia and hypokalemia Replete with IV fluids with potassium supplements and recheck in a.m.  #Alcohol abuse CIWA Patient is on detox protocol  #Essential hypertension continue home medication metoprolol and  lisinopril  #Hyponatremia continue statin  #Tobacco abuse disorder Consultation to quit smoking for 4-5 minutes. Patient verbalized understanding. Agreeable with nicotine patch.   GI prophylaxis with Protonix, DVT prophylaxis with Lovenox subcutaneous    All the records are reviewed and case discussed with ED provider. Management plans discussed with the patient, family and they are in agreement.  CODE STATUS: fc ,sister HCPOA  TOTAL TIME TAKING CARE OF THIS PATIENT: 43  minutes.   Note: This dictation was prepared with Dragon dictation along with smaller phrase technology. Any transcriptional errors that result from this process are unintentional.  Nicholes Mango M.D on 02/10/2017 at 3:43 PM  Between 7am to 6pm - Pager - [phone]  After 6pm go to [email or web address] - password [email or web address] [facility]  Office  [phone]  CC: Primary care physician; Jodi Marble, MD

## 2017-02-11 LAB — URINE CULTURE: CULTURE: NO GROWTH

## 2017-02-15 LAB — CULTURE, BLOOD (ROUTINE X 2)
CULTURE: NO GROWTH
Culture: NO GROWTH
Special Requests: ADEQUATE

## 2017-03-05 ENCOUNTER — Encounter: Payer: Self-pay | Admitting: Urology

## 2017-03-05 ENCOUNTER — Ambulatory Visit: Payer: Medicare HMO | Admitting: Urology

## 2017-03-05 VITALS — BP 104/71 | HR 76 | Ht 71.0 in | Wt 204.0 lb

## 2017-03-05 DIAGNOSIS — N529 Male erectile dysfunction, unspecified: Secondary | ICD-10-CM

## 2017-03-05 NOTE — Progress Notes (Signed)
03/05/2017 6:49 PM   Marc Fischer [birth year] [id number]  Referring provider: Jodi Marble, MD [place], [place] [address]  Chief Complaint  Patient presents with  . Erectile Dysfunction    HPI: 67 year old male with erectile dysfunction on intracavernosal injections with tri-mix.  He saw Zara Council in November 2018 and stated he did not feel his current dose was as effective as previous.  He was injecting 1 cc of standard tri-mix.  He states when he called to check on the status he was told that a refill was "inappropriate".  On record review messages were sent for his tri-mix to be refilled at a different dose.   PMH: Past Medical History:  Diagnosis Date  . BPH (benign prostatic hyperplasia)   . ED (erectile dysfunction)   . HLD (hyperlipidemia)   . HTN (hypertension)   . Lipoma   . Rectal pain   . Urethritis     Surgical History: Past Surgical History:  Procedure Laterality Date  . none      Home Medications:  Allergies as of 03/05/2017      Reactions   Penicillins    Has patient had a PCN reaction causing immediate rash, facial/tongue/throat swelling, SOB or lightheadedness with hypotension: No Has patient had a PCN reaction causing severe rash involving mucus membranes or skin necrosis: No Has patient had a PCN reaction that required hospitalization: No Has patient had a PCN reaction occurring within the last 10 years: No If all of the above answers are "NO", then may proceed with Cephalosporin use.      Medication List        Accurate as of 03/05/17  6:49 PM. Always use your most recent med list.          aspirin 325 MG tablet Take 325-650 mg by mouth daily.   atorvastatin 20 MG tablet Commonly known as:  LIPITOR Take 20 mg by mouth daily.   EDEX 10 MCG injection Generic drug:  alprostadil USE AS DIRECTED AS NEEDED   gabapentin 100 MG capsule Commonly known as:  NEURONTIN Take 100 mg by mouth at bedtime.     lisinopril 20 MG tablet Commonly known as:  PRINIVIL,ZESTRIL Take 40 mg by mouth daily.   metoprolol succinate 50 MG 24 hr tablet Commonly known as:  TOPROL-XL Take 50 mg by mouth daily. Take with or immediately following a meal.   NON FORMULARY Trimix injection   omeprazole 20 MG capsule Commonly known as:  PRILOSEC Take 20 mg by mouth daily.   temazepam 15 MG capsule Commonly known as:  RESTORIL Take 15 mg by mouth at bedtime as needed for sleep.   vardenafil 20 MG tablet Commonly known as:  LEVITRA Take 1 tablet (20 mg total) by mouth daily as needed for erectile dysfunction.       Allergies:  Allergies  Allergen Reactions  . Penicillins     Has patient had a PCN reaction causing immediate rash, facial/tongue/throat swelling, SOB or lightheadedness with hypotension: No Has patient had a PCN reaction causing severe rash involving mucus membranes or skin necrosis: No Has patient had a PCN reaction that required hospitalization: No Has patient had a PCN reaction occurring within the last 10 years: No If all of the above answers are "NO", then may proceed with Cephalosporin use.     Family History: Family History  Problem Relation Age of Onset  . Kidney disease Neg Hx   . Prostate cancer Neg Hx  Social History:  reports that he has been smoking.  he has never used smokeless tobacco. He reports that he drinks alcohol. He reports that he does not use drugs.  ROS: UROLOGY Frequent Urination?: No Hard to postpone urination?: No Burning/pain with urination?: No Get up at night to urinate?: No Leakage of urine?: No Urine stream starts and stops?: No Trouble starting stream?: No Do you have to strain to urinate?: No Blood in urine?: No Urinary tract infection?: No Sexually transmitted disease?: No Injury to kidneys or bladder?: No Painful intercourse?: No Weak stream?: No Erection problems?: Yes Penile pain?: No  Gastrointestinal Nausea?: No Vomiting?:  No Indigestion/heartburn?: No Diarrhea?: No Constipation?: No  Constitutional Fever: No Night sweats?: No Weight loss?: No Fatigue?: No  Skin Skin rash/lesions?: No Itching?: No  Eyes Blurred vision?: No Double vision?: No  Ears/Nose/Throat Sore throat?: No Sinus problems?: No  Hematologic/Lymphatic Swollen glands?: No Easy bruising?: No  Cardiovascular Leg swelling?: No Chest pain?: No  Respiratory Cough?: No Shortness of breath?: No  Endocrine Excessive thirst?: No  Musculoskeletal Back pain?: No Joint pain?: No  Neurological Headaches?: No Dizziness?: No  Psychologic Depression?: No Anxiety?: No  Physical Exam: BP 104/71   Pulse 76   Ht 5\' 11"  (1.803 m)   Wt 204 lb (92.5 kg)   BMI 28.45 kg/m   Constitutional:  Alert and oriented, No acute distress.   Laboratory Data: Lab Results  Component Value Date   WBC 10.1 02/10/2017   HGB 17.5 02/10/2017   HCT 50.4 02/10/2017   MCV 94.6 02/10/2017   PLT 232 02/10/2017    Lab Results  Component Value Date   CREATININE 1.25 (H) 02/10/2017    Lab Results  Component Value Date   PSA1 1.7 01/15/2017   PSA1 1.9 01/17/2016     Assessment & Plan:   1.  Erectile dysfunction   An order for super tri-mix 30/2/20 will be sent to [facility] will decrease the injection to 0.7 cc.  I again reiterated should he have an erection lasting longer than 4 hours to contact the office or proceed to the emergency department.    Return in about 1 year (around 03/05/2018) for Recheck.  Abbie Sons, [facility] [address], [place], [place] [address] [phone]

## 2018-01-01 ENCOUNTER — Ambulatory Visit: Payer: Medicare HMO | Admitting: Urology

## 2018-01-01 ENCOUNTER — Encounter: Payer: Self-pay | Admitting: Urology

## 2018-01-01 VITALS — BP 154/96 | HR 76 | Ht 70.0 in | Wt 202.0 lb

## 2018-01-01 DIAGNOSIS — N5201 Erectile dysfunction due to arterial insufficiency: Secondary | ICD-10-CM

## 2018-01-01 DIAGNOSIS — N400 Enlarged prostate without lower urinary tract symptoms: Secondary | ICD-10-CM | POA: Diagnosis not present

## 2018-01-03 ENCOUNTER — Encounter: Payer: Self-pay | Admitting: Urology

## 2018-01-03 DIAGNOSIS — N400 Enlarged prostate without lower urinary tract symptoms: Secondary | ICD-10-CM | POA: Insufficient documentation

## 2018-01-03 DIAGNOSIS — N5201 Erectile dysfunction due to arterial insufficiency: Secondary | ICD-10-CM | POA: Insufficient documentation

## 2018-01-03 NOTE — Progress Notes (Signed)
01/01/2018 11:53 AM   Marc Fischer Jul 22, 1950 [id number]  Referring provider: Jodi Marble, MD [place], [place] [address]  Chief Complaint  Patient presents with  . Erectile Dysfunction    HPI: 67 year old male presents for follow-up of erectile dysfunction.  He is requesting refill of his Trimix.  He was last seen January 2019 and the concentration of Trimix was increased to 30/2/20 which he states was effective.  He does not have frequent intercourse and wanted to be able to get a refill when needed.    He denies bothersome lower urinary tract symptoms.  IPSS was 1-2/35.  He has nocturia x1-2 which is not bothersome.  He states his DRE was checked recently by his PCP.  He has not had a PSA since November 2018.   PMH: Past Medical History:  Diagnosis Date  . BPH (benign prostatic hyperplasia)   . ED (erectile dysfunction)   . HLD (hyperlipidemia)   . HTN (hypertension)   . Lipoma   . Rectal pain   . Urethritis     Surgical History: Past Surgical History:  Procedure Laterality Date  . none      Home Medications:  Allergies as of 01/01/2018      Reactions   Penicillins    Has patient had a PCN reaction causing immediate rash, facial/tongue/throat swelling, SOB or lightheadedness with hypotension: No Has patient had a PCN reaction causing severe rash involving mucus membranes or skin necrosis: No Has patient had a PCN reaction that required hospitalization: No Has patient had a PCN reaction occurring within the last 10 years: No If all of the above answers are "NO", then may proceed with Cephalosporin use.      Medication List        Accurate as of 01/01/18 11:59 PM. Always use your most recent med list.          aspirin 325 MG tablet Take 325-650 mg by mouth daily.   atorvastatin 20 MG tablet Commonly known as:  LIPITOR Take 20 mg by mouth daily.   EDEX 10 MCG injection Generic drug:  alprostadil USE AS DIRECTED AS NEEDED     gabapentin 100 MG capsule Commonly known as:  NEURONTIN Take 100 mg by mouth at bedtime.   lisinopril 20 MG tablet Commonly known as:  PRINIVIL,ZESTRIL Take 40 mg by mouth daily.   lisinopril-hydrochlorothiazide 20-12.5 MG tablet Commonly known as:  PRINZIDE,ZESTORETIC TAKE 2 TABLETS BY MOUTH EVERYDAY- PLEASE MAKE APPT FOR FURTHER REFILLS AND HAVE LABS AS WELL   metoprolol succinate 50 MG 24 hr tablet Commonly known as:  TOPROL-XL Take 50 mg by mouth daily. Take with or immediately following a meal.   NON FORMULARY Trimix injection   omeprazole 20 MG capsule Commonly known as:  PRILOSEC Take 20 mg by mouth daily.   temazepam 15 MG capsule Commonly known as:  RESTORIL Take 15 mg by mouth at bedtime as needed for sleep.       Allergies:  Allergies  Allergen Reactions  . Penicillins     Has patient had a PCN reaction causing immediate rash, facial/tongue/throat swelling, SOB or lightheadedness with hypotension: No Has patient had a PCN reaction causing severe rash involving mucus membranes or skin necrosis: No Has patient had a PCN reaction that required hospitalization: No Has patient had a PCN reaction occurring within the last 10 years: No If all of the above answers are "NO", then may proceed with Cephalosporin use.  Family History: Family History  Problem Relation Age of Onset  . Kidney disease Neg Hx   . Prostate cancer Neg Hx     Social History:  reports that he has been smoking. He has never used smokeless tobacco. He reports that he drinks alcohol. He reports that he does not use drugs.  ROS: UROLOGY Frequent Urination?: No Hard to postpone urination?: No Burning/pain with urination?: No Get up at night to urinate?: No Leakage of urine?: No Urine stream starts and stops?: No Trouble starting stream?: No Do you have to strain to urinate?: No Blood in urine?: No Urinary tract infection?: No Sexually transmitted disease?: No Injury to kidneys or  bladder?: No Painful intercourse?: No Weak stream?: No Erection problems?: No Penile pain?: No  Gastrointestinal Nausea?: No Vomiting?: No Indigestion/heartburn?: No Diarrhea?: No Constipation?: No  Constitutional Fever: No Night sweats?: No Weight loss?: No Fatigue?: No  Skin Skin rash/lesions?: No Itching?: No  Eyes Blurred vision?: No Double vision?: No  Ears/Nose/Throat Sore throat?: No Sinus problems?: No  Hematologic/Lymphatic Swollen glands?: No Easy bruising?: No  Cardiovascular Leg swelling?: No Chest pain?: No  Respiratory Cough?: No Shortness of breath?: No  Endocrine Excessive thirst?: No  Musculoskeletal Back pain?: No Joint pain?: No  Neurological Headaches?: No Dizziness?: No  Psychologic Depression?: No Anxiety?: No  Physical Exam: BP (!) 154/96 (BP Location: Left Arm, Patient Position: Sitting, Cuff Size: Normal)   Pulse 76   Ht 5\' 10"  (1.778 m)   Wt 202 lb (91.6 kg)   BMI 28.98 kg/m   Constitutional:  Alert and oriented, No acute distress.     Assessment & Plan:   67 year old male with erectile dysfunction on intracavernosal injections.  He will hold back for refill when needed.  He desires to continue annual PSA testing until age 55.  He states he did not have time today to have blood drawn and will return for a lab visit.   Return in about 1 year (around 01/02/2019) for Recheck.  Abbie Sons, [facility] [address], [place], [place] [address] [phone]

## 2018-01-13 ENCOUNTER — Other Ambulatory Visit: Payer: Medicare HMO

## 2018-01-13 DIAGNOSIS — N400 Enlarged prostate without lower urinary tract symptoms: Secondary | ICD-10-CM

## 2018-01-14 LAB — PSA: Prostate Specific Ag, Serum: 1.3 ng/mL (ref 0.0–4.0)

## 2018-01-18 ENCOUNTER — Ambulatory Visit: Payer: Self-pay | Admitting: Urology

## 2018-01-19 ENCOUNTER — Telehealth: Payer: Self-pay

## 2018-01-19 NOTE — Telephone Encounter (Signed)
Called pt's home and cell numbers, no answer or VM for either. 1st attempt.

## 2018-01-19 NOTE — Telephone Encounter (Signed)
-----   Message from Abbie Sons, MD sent at 01/18/2018  1:16 PM EST ----- PSA stable at 1.3

## 2018-01-20 NOTE — Telephone Encounter (Signed)
Pt informed

## 2018-03-09 ENCOUNTER — Ambulatory Visit: Payer: Self-pay | Admitting: Urology

## 2018-09-07 IMAGING — CR DG CHEST 2V
1 series · 2 of 2 positions shown · non-contrast
Comparison: None.

CLINICAL DATA: Cough, tachycardia

EXAM:
CHEST  2 VIEW

[Series 1: dg chest 2 view · 0.14mm/px · 2 of 2 slices shown]
[im 1/2]
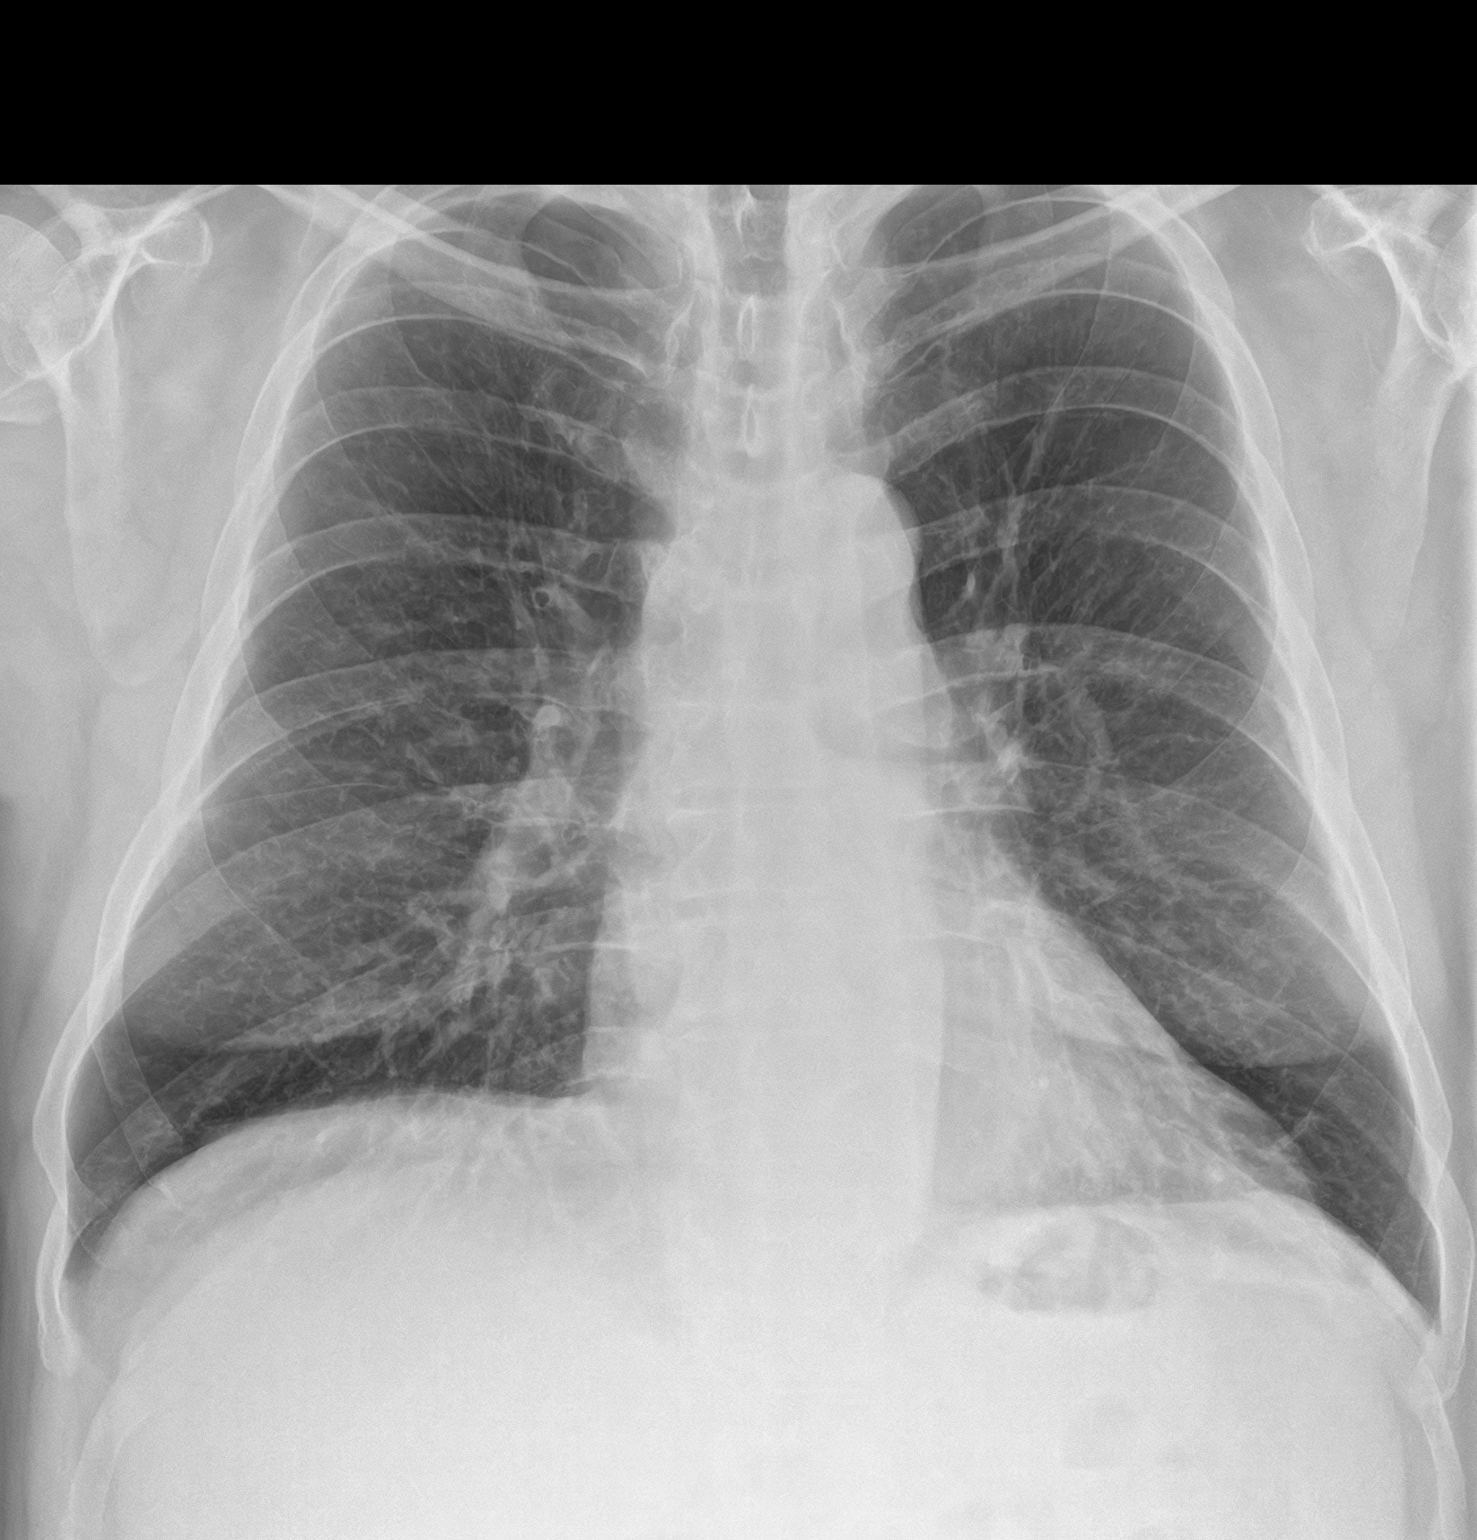
[im 2/2]
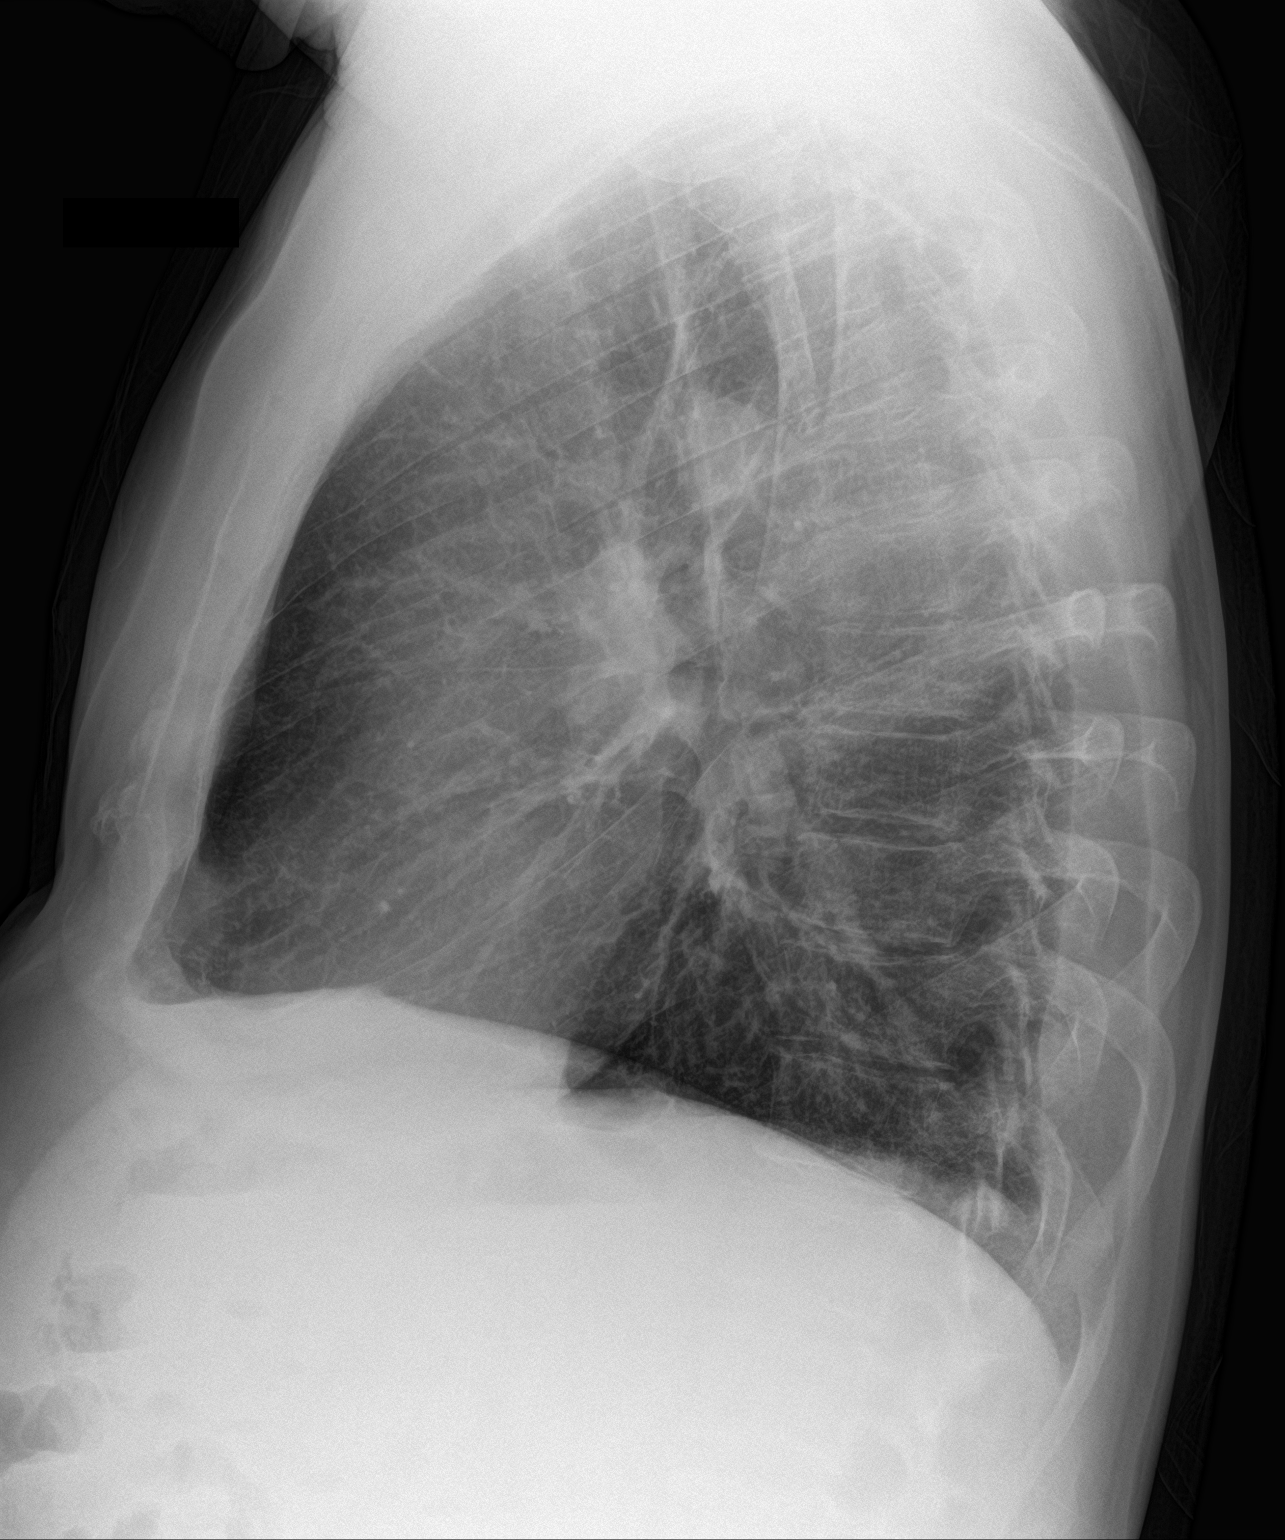

[2 of 2 positions shown; findings below may reference images not displayed]

FINDINGS: Heart and mediastinal contours are within normal limits. No focal
opacities or effusions. No acute bony abnormality. Mild
hyperinflation.
IMPRESSION: Mild hyperinflation.  No active cardiopulmonary disease.

## 2020-02-22 DIAGNOSIS — G4709 Other insomnia: Secondary | ICD-10-CM | POA: Diagnosis not present

## 2020-02-22 DIAGNOSIS — N189 Chronic kidney disease, unspecified: Secondary | ICD-10-CM | POA: Diagnosis not present

## 2020-02-22 DIAGNOSIS — K219 Gastro-esophageal reflux disease without esophagitis: Secondary | ICD-10-CM | POA: Diagnosis not present

## 2020-02-22 DIAGNOSIS — E785 Hyperlipidemia, unspecified: Secondary | ICD-10-CM | POA: Diagnosis not present

## 2020-02-22 DIAGNOSIS — R69 Illness, unspecified: Secondary | ICD-10-CM | POA: Diagnosis not present

## 2020-02-22 DIAGNOSIS — E876 Hypokalemia: Secondary | ICD-10-CM | POA: Diagnosis not present

## 2020-02-22 DIAGNOSIS — I10 Essential (primary) hypertension: Secondary | ICD-10-CM | POA: Diagnosis not present

## 2020-02-22 DIAGNOSIS — I6930 Unspecified sequelae of cerebral infarction: Secondary | ICD-10-CM | POA: Diagnosis not present

## 2020-02-22 DIAGNOSIS — E749 Disorder of carbohydrate metabolism, unspecified: Secondary | ICD-10-CM | POA: Diagnosis not present

## 2020-03-27 DIAGNOSIS — E876 Hypokalemia: Secondary | ICD-10-CM | POA: Diagnosis not present

## 2020-03-27 DIAGNOSIS — E749 Disorder of carbohydrate metabolism, unspecified: Secondary | ICD-10-CM | POA: Diagnosis not present

## 2020-03-27 DIAGNOSIS — N189 Chronic kidney disease, unspecified: Secondary | ICD-10-CM | POA: Diagnosis not present

## 2020-03-27 DIAGNOSIS — R69 Illness, unspecified: Secondary | ICD-10-CM | POA: Diagnosis not present

## 2020-03-27 DIAGNOSIS — E785 Hyperlipidemia, unspecified: Secondary | ICD-10-CM | POA: Diagnosis not present

## 2020-03-27 DIAGNOSIS — G4709 Other insomnia: Secondary | ICD-10-CM | POA: Diagnosis not present

## 2020-03-27 DIAGNOSIS — K219 Gastro-esophageal reflux disease without esophagitis: Secondary | ICD-10-CM | POA: Diagnosis not present

## 2020-03-27 DIAGNOSIS — I6930 Unspecified sequelae of cerebral infarction: Secondary | ICD-10-CM | POA: Diagnosis not present

## 2020-03-27 DIAGNOSIS — I10 Essential (primary) hypertension: Secondary | ICD-10-CM | POA: Diagnosis not present

## 2020-07-31 DIAGNOSIS — E785 Hyperlipidemia, unspecified: Secondary | ICD-10-CM | POA: Diagnosis not present

## 2020-08-15 DIAGNOSIS — N189 Chronic kidney disease, unspecified: Secondary | ICD-10-CM | POA: Diagnosis not present

## 2020-08-15 DIAGNOSIS — R69 Illness, unspecified: Secondary | ICD-10-CM | POA: Diagnosis not present

## 2020-08-15 DIAGNOSIS — I6930 Unspecified sequelae of cerebral infarction: Secondary | ICD-10-CM | POA: Diagnosis not present

## 2020-08-15 DIAGNOSIS — I10 Essential (primary) hypertension: Secondary | ICD-10-CM | POA: Diagnosis not present

## 2020-08-15 DIAGNOSIS — E785 Hyperlipidemia, unspecified: Secondary | ICD-10-CM | POA: Diagnosis not present

## 2020-08-15 DIAGNOSIS — G4709 Other insomnia: Secondary | ICD-10-CM | POA: Diagnosis not present

## 2020-08-15 DIAGNOSIS — K219 Gastro-esophageal reflux disease without esophagitis: Secondary | ICD-10-CM | POA: Diagnosis not present

## 2020-08-15 DIAGNOSIS — E876 Hypokalemia: Secondary | ICD-10-CM | POA: Diagnosis not present

## 2020-08-15 DIAGNOSIS — E749 Disorder of carbohydrate metabolism, unspecified: Secondary | ICD-10-CM | POA: Diagnosis not present

## 2020-11-13 DIAGNOSIS — E876 Hypokalemia: Secondary | ICD-10-CM | POA: Diagnosis not present

## 2020-11-13 DIAGNOSIS — I10 Essential (primary) hypertension: Secondary | ICD-10-CM | POA: Diagnosis not present

## 2020-11-13 DIAGNOSIS — N189 Chronic kidney disease, unspecified: Secondary | ICD-10-CM | POA: Diagnosis not present

## 2020-11-13 DIAGNOSIS — E782 Mixed hyperlipidemia: Secondary | ICD-10-CM | POA: Diagnosis not present

## 2020-11-13 DIAGNOSIS — E785 Hyperlipidemia, unspecified: Secondary | ICD-10-CM | POA: Diagnosis not present

## 2020-11-13 DIAGNOSIS — R7301 Impaired fasting glucose: Secondary | ICD-10-CM | POA: Diagnosis not present

## 2020-11-13 DIAGNOSIS — E669 Obesity, unspecified: Secondary | ICD-10-CM | POA: Diagnosis not present

## 2022-03-17 ENCOUNTER — Emergency Department: Payer: Medicare HMO

## 2022-03-17 ENCOUNTER — Encounter: Payer: Self-pay | Admitting: Emergency Medicine

## 2022-03-17 ENCOUNTER — Other Ambulatory Visit: Payer: Self-pay

## 2022-03-17 ENCOUNTER — Observation Stay
Admission: EM | Admit: 2022-03-17 | Discharge: 2022-03-17 | Discharge status: Against Medical Advice | Payer: Medicare HMO | Attending: Internal Medicine | Admitting: Internal Medicine

## 2022-03-17 DIAGNOSIS — K029 Dental caries, unspecified: Secondary | ICD-10-CM | POA: Diagnosis not present

## 2022-03-17 DIAGNOSIS — I10 Essential (primary) hypertension: Secondary | ICD-10-CM | POA: Insufficient documentation

## 2022-03-17 DIAGNOSIS — H547 Unspecified visual loss: Secondary | ICD-10-CM | POA: Diagnosis not present

## 2022-03-17 DIAGNOSIS — F1910 Other psychoactive substance abuse, uncomplicated: Secondary | ICD-10-CM

## 2022-03-17 DIAGNOSIS — Z79899 Other long term (current) drug therapy: Secondary | ICD-10-CM | POA: Insufficient documentation

## 2022-03-17 DIAGNOSIS — I639 Cerebral infarction, unspecified: Secondary | ICD-10-CM | POA: Diagnosis not present

## 2022-03-17 DIAGNOSIS — F17210 Nicotine dependence, cigarettes, uncomplicated: Secondary | ICD-10-CM | POA: Insufficient documentation

## 2022-03-17 DIAGNOSIS — I6601 Occlusion and stenosis of right middle cerebral artery: Secondary | ICD-10-CM | POA: Diagnosis not present

## 2022-03-17 DIAGNOSIS — Y9355 Activity, bike riding: Secondary | ICD-10-CM | POA: Insufficient documentation

## 2022-03-17 DIAGNOSIS — J3489 Other specified disorders of nose and nasal sinuses: Secondary | ICD-10-CM | POA: Diagnosis not present

## 2022-03-17 DIAGNOSIS — Y92410 Unspecified street and highway as the place of occurrence of the external cause: Secondary | ICD-10-CM | POA: Insufficient documentation

## 2022-03-17 DIAGNOSIS — H538 Other visual disturbances: Secondary | ICD-10-CM | POA: Diagnosis not present

## 2022-03-17 DIAGNOSIS — H53132 Sudden visual loss, left eye: Secondary | ICD-10-CM | POA: Diagnosis not present

## 2022-03-17 DIAGNOSIS — R69 Illness, unspecified: Secondary | ICD-10-CM | POA: Diagnosis not present

## 2022-03-17 DIAGNOSIS — Z7901 Long term (current) use of anticoagulants: Secondary | ICD-10-CM | POA: Insufficient documentation

## 2022-03-17 DIAGNOSIS — S0990XA Unspecified injury of head, initial encounter: Secondary | ICD-10-CM | POA: Diagnosis not present

## 2022-03-17 LAB — CBC WITH DIFFERENTIAL/PLATELET
Abs Immature Granulocytes: 0.03 10*3/uL (ref 0.00–0.07)
Basophils Absolute: 0.1 10*3/uL (ref 0.0–0.1)
Basophils Relative: 1 %
Eosinophils Absolute: 0 10*3/uL (ref 0.0–0.5)
Eosinophils Relative: 0 %
HCT: 46.9 % (ref 39.0–52.0)
Hemoglobin: 16.1 g/dL (ref 13.0–17.0)
Immature Granulocytes: 0 %
Lymphocytes Relative: 18 %
Lymphs Abs: 1.4 10*3/uL (ref 0.7–4.0)
MCH: 32.6 pg (ref 26.0–34.0)
MCHC: 34.3 g/dL (ref 30.0–36.0)
MCV: 94.9 fL (ref 80.0–100.0)
Monocytes Absolute: 0.5 10*3/uL (ref 0.1–1.0)
Monocytes Relative: 7 %
Neutro Abs: 5.8 10*3/uL (ref 1.7–7.7)
Neutrophils Relative %: 74 %
Platelets: 277 10*3/uL (ref 150–400)
RBC: 4.94 MIL/uL (ref 4.22–5.81)
RDW: 12.1 % (ref 11.5–15.5)
WBC: 7.9 10*3/uL (ref 4.0–10.5)
nRBC: 0 % (ref 0.0–0.2)

## 2022-03-17 LAB — SEDIMENTATION RATE: Sed Rate: 8 mm/hr (ref 0–20)

## 2022-03-17 LAB — BASIC METABOLIC PANEL
Anion gap: 11 (ref 5–15)
BUN: 13 mg/dL (ref 8–23)
CO2: 20 mmol/L — ABNORMAL LOW (ref 22–32)
Calcium: 9.4 mg/dL (ref 8.9–10.3)
Chloride: 101 mmol/L (ref 98–111)
Creatinine, Ser: 1.13 mg/dL (ref 0.61–1.24)
GFR, Estimated: >60 mL/min (ref >60)
Glucose, Bld: 120 mg/dL — ABNORMAL HIGH (ref 70–99)
Potassium: 3.4 mmol/L — ABNORMAL LOW (ref 3.5–5.1)
Sodium: 132 mmol/L — ABNORMAL LOW (ref 135–145)

## 2022-03-17 LAB — HEPATIC FUNCTION PANEL
ALT: 19 U/L (ref 0–44)
AST: 30 U/L (ref 15–41)
Albumin: 4.2 g/dL (ref 3.5–5.0)
Alkaline Phosphatase: 74 U/L (ref 38–126)
Bilirubin, Direct: <0.1 mg/dL (ref 0.0–0.2)
Total Bilirubin: 0.7 mg/dL (ref 0.3–1.2)
Total Protein: 7.5 g/dL (ref 6.5–8.1)

## 2022-03-17 LAB — LIPID PANEL
Cholesterol: 276 mg/dL — ABNORMAL HIGH (ref 0–200)
HDL: 53 mg/dL (ref >40)
LDL Cholesterol: 195 mg/dL — ABNORMAL HIGH (ref 0–99)
Total CHOL/HDL Ratio: 5.2 RATIO
Triglycerides: 141 mg/dL (ref <150)
VLDL: 28 mg/dL (ref 0–40)

## 2022-03-17 LAB — MAGNESIUM: Magnesium: 1.8 mg/dL (ref 1.7–2.4)

## 2022-03-17 LAB — PHOSPHORUS: Phosphorus: 2.7 mg/dL (ref 2.5–4.6)

## 2022-03-17 MED ORDER — ACETAMINOPHEN 325 MG PO TABS: 650.0000 mg | ORAL_TABLET | ORAL | Status: DC | PRN

## 2022-03-17 MED ORDER — STROKE: EARLY STAGES OF RECOVERY BOOK: Freq: Once | Status: DC

## 2022-03-17 MED ORDER — LORAZEPAM 1 MG PO TABS: 1.0000 mg | ORAL_TABLET | ORAL | Status: DC | PRN

## 2022-03-17 MED ORDER — ASPIRIN 325 MG PO TABS: 325.0000 mg | ORAL_TABLET | Freq: Once | ORAL | Status: DC

## 2022-03-17 MED ORDER — THIAMINE HCL 100 MG/ML IJ SOLN: 100.0000 mg | Freq: Every day | INTRAMUSCULAR | Status: DC

## 2022-03-17 MED ORDER — ATORVASTATIN CALCIUM 20 MG PO TABS: 80.0000 mg | ORAL_TABLET | Freq: Every day | ORAL | Status: DC

## 2022-03-17 MED ORDER — ACETAMINOPHEN 160 MG/5ML PO SOLN: 650.0000 mg | ORAL | Status: DC | PRN

## 2022-03-17 MED ORDER — IOHEXOL 350 MG/ML SOLN
75.0000 mL | Freq: Once | INTRAVENOUS | Status: AC | PRN
  Administered 2022-03-17: 75 mL via INTRAVENOUS

## 2022-03-17 MED ORDER — ADULT MULTIVITAMIN W/MINERALS CH: 1.0000 | ORAL_TABLET | Freq: Every day | ORAL | Status: DC

## 2022-03-17 MED ORDER — ACETAMINOPHEN 650 MG RE SUPP: 650.0000 mg | RECTAL | Status: DC | PRN

## 2022-03-17 MED ORDER — FOLIC ACID 1 MG PO TABS: 1.0000 mg | ORAL_TABLET | Freq: Every day | ORAL | Status: DC

## 2022-03-17 MED ORDER — LORAZEPAM 2 MG/ML IJ SOLN: 1.0000 mg | INTRAMUSCULAR | Status: DC | PRN

## 2022-03-17 MED ORDER — THIAMINE MONONITRATE 100 MG PO TABS: 100.0000 mg | ORAL_TABLET | Freq: Every day | ORAL | Status: DC

## 2022-03-17 MED ORDER — SENNOSIDES-DOCUSATE SODIUM 8.6-50 MG PO TABS: 1.0000 | ORAL_TABLET | Freq: Every evening | ORAL | Status: DC | PRN

## 2022-03-17 NOTE — Assessment & Plan Note (Addendum)
Patient to olmesartan/amlodipine at home, but frequently misses doses.  - Hold home antihypertensives for now pending MRI of the brain, although the patient is likely out of the window for permissive hypertension
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
## 2022-03-17 NOTE — Assessment & Plan Note (Signed)
Patient endorses daily use of tobacco and alcohol (approximately 80 ounces of beer per day), in addition to occasional cocaine use.  - CIWA with Ativan as needed - Thiamine and folic acid daily - Counseled on the importance of cessation.  Patient open to the idea

## 2022-03-17 NOTE — ED Notes (Signed)
Informed RN bed assigned 
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
## 2022-03-17 NOTE — Progress Notes (Addendum)
Pt arrived to room 103, pt immediately stated ranting that he was leaving that "he was going home to smoke a cigarette and drink a beer" I educated pt on the benefits of staying and that he could get a nicotine patch and something for his anxiety. He refused to stay, called a ride and he signed the [facility] form. IV was removed from R-AC. I paged MD but I was not able to get them up here in time.
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
## 2022-03-17 NOTE — Assessment & Plan Note (Addendum)
Patient is present with nearly 11-day history of left vision loss with CTA demonstrating severe focal stenosis of the left ICA with a soft atherosclerotic plaque.  Vascular surgery has been consulted and will evaluate patient, although patient has stated he did does not want any kind of surgical procedure done.    - Neurology and vascular surgery consulted; appreciate their recommendations - Ophthalmology recommending outpatient follow-up in 1 week - MRI brain pending - Telemetry monitoring - Allow for permissive HTN until MRI of the brain is completed (systolic < 500 and diastolic < 164)  - Echocardiogram  - A1C and Lipid panel  - ASA 325 mg once. Start 81 mg daily tomorrow - Lipitor 80 mg daily   - PT/OT/SLP

## 2022-03-17 NOTE — Plan of Care (Signed)
EDP Dr. Jari Pigg discussed this case with me. Pt had fall one week ago, then left eye vision in and out for the next 2-3 days and now came in with left eye vision loss. CT no acute finding but old left cerebellum infarct. CTA head and neck showed left ICA bulb 80% stenosis, right ICA bulb 40% stenosis.  Left ICA siphon and left vertebral artery moderate to severe stenosis.  Patient symptoms concerning for symptomatic left ICA stenosis and I recommend vascular surgery consultation for left ICA revascularization.  I went to see patient but vascular surgery was in room talking with patient, I decided come back later.  Per note, vascular surgery recommended left carotid angiogram with balloon angioplasty and stent placement.  Patient then was then admitted to floor.  However when I try to see him and noticed that patient had signed AMA.  Rosalin Hawking, MD PhD Stroke Neurology 03/17/2022 7:37 PM

## 2022-03-18 ENCOUNTER — Ambulatory Visit: Admit: 2022-03-18 | Payer: Medicare HMO | Admitting: Vascular Surgery

## 2022-03-18 SURGERY — CAROTID ANGIOGRAPHY
Anesthesia: Moderate Sedation | Laterality: Left

## 2023-03-19 ENCOUNTER — Observation Stay: Payer: Medicare HMO

## 2023-03-19 ENCOUNTER — Emergency Department: Payer: Medicare HMO

## 2023-03-19 ENCOUNTER — Other Ambulatory Visit: Payer: Self-pay

## 2023-03-19 ENCOUNTER — Inpatient Hospital Stay: Payer: Medicare HMO

## 2023-03-19 ENCOUNTER — Inpatient Hospital Stay
Admission: EM | Admit: 2023-03-19 | Discharge: 2023-04-18 | DRG: 064 | Disposition: E | Payer: Medicare HMO | Attending: Student in an Organized Health Care Education/Training Program | Admitting: Student in an Organized Health Care Education/Training Program

## 2023-03-19 ENCOUNTER — Encounter: Payer: Self-pay | Admitting: Internal Medicine

## 2023-03-19 DIAGNOSIS — J9601 Acute respiratory failure with hypoxia: Secondary | ICD-10-CM

## 2023-03-19 DIAGNOSIS — I10 Essential (primary) hypertension: Secondary | ICD-10-CM | POA: Diagnosis present

## 2023-03-19 DIAGNOSIS — E876 Hypokalemia: Secondary | ICD-10-CM | POA: Diagnosis present

## 2023-03-19 DIAGNOSIS — W19XXXA Unspecified fall, initial encounter: Secondary | ICD-10-CM

## 2023-03-19 DIAGNOSIS — R531 Weakness: Secondary | ICD-10-CM | POA: Diagnosis not present

## 2023-03-19 DIAGNOSIS — R4189 Other symptoms and signs involving cognitive functions and awareness: Secondary | ICD-10-CM | POA: Diagnosis not present

## 2023-03-19 DIAGNOSIS — E669 Obesity, unspecified: Secondary | ICD-10-CM | POA: Diagnosis present

## 2023-03-19 DIAGNOSIS — J690 Pneumonitis due to inhalation of food and vomit: Secondary | ICD-10-CM | POA: Diagnosis present

## 2023-03-19 DIAGNOSIS — Y92009 Unspecified place in unspecified non-institutional (private) residence as the place of occurrence of the external cause: Secondary | ICD-10-CM

## 2023-03-19 DIAGNOSIS — R569 Unspecified convulsions: Secondary | ICD-10-CM

## 2023-03-19 DIAGNOSIS — F1010 Alcohol abuse, uncomplicated: Secondary | ICD-10-CM | POA: Diagnosis present

## 2023-03-19 DIAGNOSIS — N179 Acute kidney failure, unspecified: Secondary | ICD-10-CM | POA: Diagnosis present

## 2023-03-19 DIAGNOSIS — R0789 Other chest pain: Secondary | ICD-10-CM

## 2023-03-19 DIAGNOSIS — R578 Other shock: Secondary | ICD-10-CM

## 2023-03-19 DIAGNOSIS — I639 Cerebral infarction, unspecified: Principal | ICD-10-CM | POA: Diagnosis present

## 2023-03-19 DIAGNOSIS — R140 Abdominal distension (gaseous): Secondary | ICD-10-CM | POA: Diagnosis present

## 2023-03-19 DIAGNOSIS — Z789 Other specified health status: Secondary | ICD-10-CM

## 2023-03-19 DIAGNOSIS — E785 Hyperlipidemia, unspecified: Secondary | ICD-10-CM | POA: Diagnosis present

## 2023-03-19 DIAGNOSIS — Z720 Tobacco use: Secondary | ICD-10-CM | POA: Diagnosis present

## 2023-03-19 DIAGNOSIS — D735 Infarction of spleen: Secondary | ICD-10-CM | POA: Diagnosis present

## 2023-03-19 DIAGNOSIS — E872 Acidosis, unspecified: Secondary | ICD-10-CM | POA: Diagnosis present

## 2023-03-19 DIAGNOSIS — Z4682 Encounter for fitting and adjustment of non-vascular catheter: Secondary | ICD-10-CM | POA: Diagnosis not present

## 2023-03-19 DIAGNOSIS — I6501 Occlusion and stenosis of right vertebral artery: Secondary | ICD-10-CM | POA: Diagnosis not present

## 2023-03-19 DIAGNOSIS — F10150 Alcohol abuse with alcohol-induced psychotic disorder with delusions: Secondary | ICD-10-CM | POA: Diagnosis not present

## 2023-03-19 DIAGNOSIS — T794XXA Traumatic shock, initial encounter: Secondary | ICD-10-CM | POA: Diagnosis not present

## 2023-03-19 DIAGNOSIS — I1310 Hypertensive heart and chronic kidney disease without heart failure, with stage 1 through stage 4 chronic kidney disease, or unspecified chronic kidney disease: Secondary | ICD-10-CM | POA: Diagnosis not present

## 2023-03-19 DIAGNOSIS — N170 Acute kidney failure with tubular necrosis: Secondary | ICD-10-CM | POA: Diagnosis present

## 2023-03-19 DIAGNOSIS — K661 Hemoperitoneum: Secondary | ICD-10-CM | POA: Diagnosis present

## 2023-03-19 DIAGNOSIS — R29810 Facial weakness: Secondary | ICD-10-CM | POA: Diagnosis present

## 2023-03-19 DIAGNOSIS — I6389 Other cerebral infarction: Secondary | ICD-10-CM | POA: Diagnosis not present

## 2023-03-19 DIAGNOSIS — F17210 Nicotine dependence, cigarettes, uncomplicated: Secondary | ICD-10-CM | POA: Diagnosis present

## 2023-03-19 DIAGNOSIS — Z515 Encounter for palliative care: Secondary | ICD-10-CM | POA: Diagnosis not present

## 2023-03-19 DIAGNOSIS — J439 Emphysema, unspecified: Secondary | ICD-10-CM | POA: Diagnosis not present

## 2023-03-19 DIAGNOSIS — G9341 Metabolic encephalopathy: Secondary | ICD-10-CM | POA: Diagnosis not present

## 2023-03-19 DIAGNOSIS — F10231 Alcohol dependence with withdrawal delirium: Secondary | ICD-10-CM | POA: Diagnosis present

## 2023-03-19 DIAGNOSIS — Z8249 Family history of ischemic heart disease and other diseases of the circulatory system: Secondary | ICD-10-CM

## 2023-03-19 DIAGNOSIS — I700 Atherosclerosis of aorta: Secondary | ICD-10-CM | POA: Diagnosis not present

## 2023-03-19 DIAGNOSIS — R579 Shock, unspecified: Secondary | ICD-10-CM | POA: Diagnosis not present

## 2023-03-19 DIAGNOSIS — S2242XA Multiple fractures of ribs, left side, initial encounter for closed fracture: Secondary | ICD-10-CM | POA: Diagnosis not present

## 2023-03-19 DIAGNOSIS — S36029A Unspecified contusion of spleen, initial encounter: Secondary | ICD-10-CM | POA: Diagnosis not present

## 2023-03-19 DIAGNOSIS — R4182 Altered mental status, unspecified: Secondary | ICD-10-CM | POA: Diagnosis not present

## 2023-03-19 DIAGNOSIS — G4733 Obstructive sleep apnea (adult) (pediatric): Secondary | ICD-10-CM | POA: Diagnosis present

## 2023-03-19 DIAGNOSIS — Z66 Do not resuscitate: Secondary | ICD-10-CM | POA: Diagnosis not present

## 2023-03-19 DIAGNOSIS — Y901 Blood alcohol level of 20-39 mg/100 ml: Secondary | ICD-10-CM | POA: Diagnosis not present

## 2023-03-19 DIAGNOSIS — M8580 Other specified disorders of bone density and structure, unspecified site: Secondary | ICD-10-CM | POA: Diagnosis not present

## 2023-03-19 DIAGNOSIS — F1410 Cocaine abuse, uncomplicated: Secondary | ICD-10-CM | POA: Diagnosis not present

## 2023-03-19 DIAGNOSIS — I214 Non-ST elevation (NSTEMI) myocardial infarction: Secondary | ICD-10-CM | POA: Diagnosis not present

## 2023-03-19 DIAGNOSIS — R0781 Pleurodynia: Secondary | ICD-10-CM | POA: Diagnosis not present

## 2023-03-19 DIAGNOSIS — T730XXA Starvation, initial encounter: Secondary | ICD-10-CM | POA: Diagnosis present

## 2023-03-19 DIAGNOSIS — F10931 Alcohol use, unspecified with withdrawal delirium: Secondary | ICD-10-CM | POA: Diagnosis not present

## 2023-03-19 DIAGNOSIS — I6523 Occlusion and stenosis of bilateral carotid arteries: Secondary | ICD-10-CM | POA: Diagnosis not present

## 2023-03-19 DIAGNOSIS — N182 Chronic kidney disease, stage 2 (mild): Secondary | ICD-10-CM | POA: Diagnosis present

## 2023-03-19 DIAGNOSIS — G936 Cerebral edema: Secondary | ICD-10-CM | POA: Diagnosis not present

## 2023-03-19 DIAGNOSIS — S36021A Major contusion of spleen, initial encounter: Secondary | ICD-10-CM | POA: Diagnosis not present

## 2023-03-19 DIAGNOSIS — Z79899 Other long term (current) drug therapy: Secondary | ICD-10-CM

## 2023-03-19 DIAGNOSIS — R571 Hypovolemic shock: Secondary | ICD-10-CM | POA: Diagnosis not present

## 2023-03-19 DIAGNOSIS — F1090 Alcohol use, unspecified, uncomplicated: Secondary | ICD-10-CM | POA: Diagnosis not present

## 2023-03-19 DIAGNOSIS — R109 Unspecified abdominal pain: Secondary | ICD-10-CM | POA: Diagnosis not present

## 2023-03-19 DIAGNOSIS — I672 Cerebral atherosclerosis: Secondary | ICD-10-CM | POA: Diagnosis not present

## 2023-03-19 DIAGNOSIS — I6381 Other cerebral infarction due to occlusion or stenosis of small artery: Secondary | ICD-10-CM | POA: Diagnosis not present

## 2023-03-19 DIAGNOSIS — S36039A Unspecified laceration of spleen, initial encounter: Secondary | ICD-10-CM | POA: Diagnosis not present

## 2023-03-19 DIAGNOSIS — R4781 Slurred speech: Secondary | ICD-10-CM | POA: Diagnosis present

## 2023-03-19 DIAGNOSIS — K760 Fatty (change of) liver, not elsewhere classified: Secondary | ICD-10-CM | POA: Diagnosis present

## 2023-03-19 DIAGNOSIS — R0989 Other specified symptoms and signs involving the circulatory and respiratory systems: Secondary | ICD-10-CM | POA: Diagnosis not present

## 2023-03-19 DIAGNOSIS — R918 Other nonspecific abnormal finding of lung field: Secondary | ICD-10-CM | POA: Diagnosis not present

## 2023-03-19 DIAGNOSIS — Z6830 Body mass index (BMI) 30.0-30.9, adult: Secondary | ICD-10-CM | POA: Diagnosis not present

## 2023-03-19 DIAGNOSIS — Z1389 Encounter for screening for other disorder: Secondary | ICD-10-CM | POA: Diagnosis not present

## 2023-03-19 DIAGNOSIS — I63511 Cerebral infarction due to unspecified occlusion or stenosis of right middle cerebral artery: Secondary | ICD-10-CM | POA: Diagnosis not present

## 2023-03-19 DIAGNOSIS — G8194 Hemiplegia, unspecified affecting left nondominant side: Secondary | ICD-10-CM | POA: Diagnosis not present

## 2023-03-19 DIAGNOSIS — D62 Acute posthemorrhagic anemia: Secondary | ICD-10-CM | POA: Diagnosis not present

## 2023-03-19 DIAGNOSIS — Z743 Need for continuous supervision: Secondary | ICD-10-CM | POA: Diagnosis not present

## 2023-03-19 DIAGNOSIS — I517 Cardiomegaly: Secondary | ICD-10-CM | POA: Diagnosis not present

## 2023-03-19 DIAGNOSIS — R160 Hepatomegaly, not elsewhere classified: Secondary | ICD-10-CM | POA: Diagnosis not present

## 2023-03-19 DIAGNOSIS — S0990XA Unspecified injury of head, initial encounter: Secondary | ICD-10-CM | POA: Diagnosis not present

## 2023-03-19 DIAGNOSIS — G9389 Other specified disorders of brain: Secondary | ICD-10-CM | POA: Diagnosis not present

## 2023-03-19 DIAGNOSIS — Z880 Allergy status to penicillin: Secondary | ICD-10-CM

## 2023-03-19 DIAGNOSIS — S199XXA Unspecified injury of neck, initial encounter: Secondary | ICD-10-CM | POA: Diagnosis not present

## 2023-03-19 DIAGNOSIS — I651 Occlusion and stenosis of basilar artery: Secondary | ICD-10-CM | POA: Diagnosis not present

## 2023-03-19 DIAGNOSIS — Z823 Family history of stroke: Secondary | ICD-10-CM

## 2023-03-19 DIAGNOSIS — R471 Dysarthria and anarthria: Secondary | ICD-10-CM | POA: Diagnosis present

## 2023-03-19 DIAGNOSIS — G939 Disorder of brain, unspecified: Secondary | ICD-10-CM | POA: Diagnosis not present

## 2023-03-19 DIAGNOSIS — G319 Degenerative disease of nervous system, unspecified: Secondary | ICD-10-CM | POA: Diagnosis not present

## 2023-03-19 DIAGNOSIS — R1310 Dysphagia, unspecified: Secondary | ICD-10-CM | POA: Diagnosis present

## 2023-03-19 DIAGNOSIS — I6782 Cerebral ischemia: Secondary | ICD-10-CM | POA: Diagnosis not present

## 2023-03-19 DIAGNOSIS — R29706 NIHSS score 6: Secondary | ICD-10-CM | POA: Diagnosis present

## 2023-03-19 HISTORY — DX: Alcohol abuse, uncomplicated: F10.10

## 2023-03-19 HISTORY — DX: Tobacco use: Z72.0

## 2023-03-19 HISTORY — DX: Cerebral infarction, unspecified: I63.9

## 2023-03-19 HISTORY — DX: Hyperlipidemia, unspecified: E78.5

## 2023-03-19 HISTORY — DX: Essential (primary) hypertension: I10

## 2023-03-19 LAB — AMMONIA: Ammonia: 20 umol/L (ref 9–35)

## 2023-03-19 LAB — COMPREHENSIVE METABOLIC PANEL
ALT: 19 U/L (ref 0–44)
AST: 30 U/L (ref 15–41)
Albumin: 3.5 g/dL (ref 3.5–5.0)
Alkaline Phosphatase: 62 U/L (ref 38–126)
Anion gap: 15 (ref 5–15)
BUN: 14 mg/dL (ref 8–23)
CO2: 19 mmol/L — ABNORMAL LOW (ref 22–32)
Calcium: 8.3 mg/dL — ABNORMAL LOW (ref 8.9–10.3)
Chloride: 99 mmol/L (ref 98–111)
Creatinine, Ser: 1.41 mg/dL — ABNORMAL HIGH (ref 0.61–1.24)
GFR, Estimated: 53 mL/min — ABNORMAL LOW (ref >60)
Glucose, Bld: 125 mg/dL — ABNORMAL HIGH (ref 70–99)
Potassium: 3.1 mmol/L — ABNORMAL LOW (ref 3.5–5.1)
Sodium: 133 mmol/L — ABNORMAL LOW (ref 135–145)
Total Bilirubin: 0.6 mg/dL (ref 0.0–1.2)
Total Protein: 6.5 g/dL (ref 6.5–8.1)

## 2023-03-19 LAB — CBC WITH DIFFERENTIAL/PLATELET
Abs Immature Granulocytes: 0.07 10*3/uL (ref 0.00–0.07)
Basophils Absolute: 0.1 10*3/uL (ref 0.0–0.1)
Basophils Relative: 1 %
Eosinophils Absolute: 0.1 10*3/uL (ref 0.0–0.5)
Eosinophils Relative: 1 %
HCT: 40.6 % (ref 39.0–52.0)
Hemoglobin: 14.2 g/dL (ref 13.0–17.0)
Immature Granulocytes: 1 %
Lymphocytes Relative: 16 %
Lymphs Abs: 2 10*3/uL (ref 0.7–4.0)
MCH: 33.3 pg (ref 26.0–34.0)
MCHC: 35 g/dL (ref 30.0–36.0)
MCV: 95.1 fL (ref 80.0–100.0)
Monocytes Absolute: 0.8 10*3/uL (ref 0.1–1.0)
Monocytes Relative: 6 %
Neutro Abs: 10 10*3/uL — ABNORMAL HIGH (ref 1.7–7.7)
Neutrophils Relative %: 75 %
Platelets: 225 10*3/uL (ref 150–400)
RBC: 4.27 MIL/uL (ref 4.22–5.81)
RDW: 12.2 % (ref 11.5–15.5)
WBC: 13 10*3/uL — ABNORMAL HIGH (ref 4.0–10.5)
nRBC: 0 % (ref 0.0–0.2)

## 2023-03-19 LAB — MAGNESIUM: Magnesium: 1.4 mg/dL — ABNORMAL LOW (ref 1.7–2.4)

## 2023-03-19 LAB — PHOSPHORUS: Phosphorus: 3.6 mg/dL (ref 2.5–4.6)

## 2023-03-19 LAB — CBC
HCT: 34.9 % — ABNORMAL LOW (ref 39.0–52.0)
Hemoglobin: 12 g/dL — ABNORMAL LOW (ref 13.0–17.0)
MCH: 33.1 pg (ref 26.0–34.0)
MCHC: 34.4 g/dL (ref 30.0–36.0)
MCV: 96.1 fL (ref 80.0–100.0)
Platelets: 197 10*3/uL (ref 150–400)
RBC: 3.63 MIL/uL — ABNORMAL LOW (ref 4.22–5.81)
RDW: 12.3 % (ref 11.5–15.5)
WBC: 9.8 10*3/uL (ref 4.0–10.5)
nRBC: 0 % (ref 0.0–0.2)

## 2023-03-19 LAB — LIPASE, BLOOD: Lipase: 32 U/L (ref 11–51)

## 2023-03-19 LAB — ETHANOL: Alcohol, Ethyl (B): 35 mg/dL — ABNORMAL HIGH (ref <10)

## 2023-03-19 LAB — TROPONIN I (HIGH SENSITIVITY): Troponin I (High Sensitivity): 12 ng/L (ref <18)

## 2023-03-19 MED ORDER — ASPIRIN 300 MG RE SUPP
300.0000 mg | Freq: Every day | RECTAL | Status: DC
Start: 2023-03-19 — End: 2023-03-19

## 2023-03-19 MED ORDER — ASPIRIN 325 MG PO TABS
325.0000 mg | ORAL_TABLET | Freq: Every day | ORAL | Status: DC
Start: 2023-03-19 — End: 2023-03-19

## 2023-03-19 MED ORDER — ENOXAPARIN SODIUM 40 MG/0.4ML IJ SOSY
40.0000 mg | PREFILLED_SYRINGE | INTRAMUSCULAR | Status: DC
Start: 2023-03-19 — End: 2023-03-19

## 2023-03-19 MED ORDER — NICOTINE 21 MG/24HR TD PT24
21.0000 mg | MEDICATED_PATCH | Freq: Every day | TRANSDERMAL | Status: DC
Start: 2023-03-19 — End: 2023-03-23
  Administered 2023-03-22: 21 mg via TRANSDERMAL
  Filled 2023-03-19: qty 1

## 2023-03-19 MED ORDER — IOHEXOL 350 MG/ML SOLN
75.0000 mL | Freq: Once | INTRAVENOUS | Status: AC | PRN
  Administered 2023-03-19: 75 mL via INTRAVENOUS

## 2023-03-19 MED ORDER — THIAMINE MONONITRATE 100 MG PO TABS
100.0000 mg | ORAL_TABLET | Freq: Every day | ORAL | Status: DC
  Filled 2023-03-19: qty 1

## 2023-03-19 MED ORDER — LACTATED RINGERS IV BOLUS
1000.0000 mL | Freq: Once | INTRAVENOUS | Status: AC
  Administered 2023-03-19: 1000 mL via INTRAVENOUS

## 2023-03-19 MED ORDER — POTASSIUM CHLORIDE CRYS ER 20 MEQ PO TBCR: 40.0000 meq | EXTENDED_RELEASE_TABLET | Freq: Once | ORAL | Status: DC

## 2023-03-19 MED ORDER — LORAZEPAM 2 MG/ML IJ SOLN
1.0000 mg | INTRAMUSCULAR | Status: AC | PRN
  Administered 2023-03-20: 1 mg via INTRAVENOUS
  Administered 2023-03-20 (×3): 2 mg via INTRAVENOUS
  Administered 2023-03-21: 4 mg via INTRAVENOUS
  Administered 2023-03-21: 1 mg via INTRAVENOUS
  Administered 2023-03-21: 2 mg via INTRAVENOUS
  Administered 2023-03-21: 3 mg via INTRAVENOUS
  Administered 2023-03-21: 1 mg via INTRAVENOUS
  Administered 2023-03-21: 3 mg via INTRAVENOUS
  Administered 2023-03-21: 2 mg via INTRAVENOUS
  Administered 2023-03-21: 1 mg via INTRAVENOUS
  Filled 2023-03-19: qty 1
  Filled 2023-03-19: qty 2
  Filled 2023-03-19 (×2): qty 1
  Filled 2023-03-19 (×2): qty 2
  Filled 2023-03-19 (×5): qty 1

## 2023-03-19 MED ORDER — ACETAMINOPHEN 500 MG PO TABS
1000.0000 mg | ORAL_TABLET | Freq: Once | ORAL | Status: DC
  Filled 2023-03-19: qty 2

## 2023-03-19 MED ORDER — LORAZEPAM 1 MG PO TABS: 1.0000 mg | ORAL_TABLET | ORAL | Status: AC | PRN

## 2023-03-19 MED ORDER — LEVETIRACETAM IN NACL 500 MG/100ML IV SOLN
500.0000 mg | Freq: Two times a day (BID) | INTRAVENOUS | Status: DC
  Administered 2023-03-20: 500 mg via INTRAVENOUS
  Filled 2023-03-19 (×2): qty 100

## 2023-03-19 MED ORDER — SODIUM CHLORIDE 0.9 % IV SOLN
3.0000 g | Freq: Four times a day (QID) | INTRAVENOUS | Status: AC
  Administered 2023-03-19 – 2023-03-24 (×19): 3 g via INTRAVENOUS
  Filled 2023-03-19 (×19): qty 8

## 2023-03-19 MED ORDER — LORAZEPAM 2 MG/ML IJ SOLN
2.0000 mg | INTRAMUSCULAR | Status: DC | PRN
  Filled 2023-03-19: qty 1

## 2023-03-19 MED ORDER — SODIUM CHLORIDE 0.9 % IV SOLN: 2000.0000 mg | Freq: Once | INTRAVENOUS | Status: DC

## 2023-03-19 MED ORDER — LORAZEPAM 2 MG/ML IJ SOLN
INTRAMUSCULAR | Status: AC
  Filled 2023-03-19: qty 1

## 2023-03-19 MED ORDER — ACETAMINOPHEN 325 MG RE SUPP: 650.0000 mg | RECTAL | Status: DC | PRN

## 2023-03-19 MED ORDER — DEXTROSE-SODIUM CHLORIDE 5-0.9 % IV SOLN: INTRAVENOUS | Status: AC

## 2023-03-19 MED ORDER — FOLIC ACID 1 MG PO TABS
1.0000 mg | ORAL_TABLET | Freq: Every day | ORAL | Status: DC
  Filled 2023-03-19: qty 1

## 2023-03-19 MED ORDER — HYDRALAZINE HCL 20 MG/ML IJ SOLN
5.0000 mg | INTRAMUSCULAR | Status: DC | PRN
  Administered 2023-03-21 – 2023-03-25 (×2): 5 mg via INTRAVENOUS
  Filled 2023-03-19 (×2): qty 1

## 2023-03-19 MED ORDER — LEVETIRACETAM IN NACL 1000 MG/100ML IV SOLN
1000.0000 mg | Freq: Once | INTRAVENOUS | Status: AC
  Administered 2023-03-19: 1000 mg via INTRAVENOUS
  Filled 2023-03-19: qty 100

## 2023-03-19 MED ORDER — STROKE: EARLY STAGES OF RECOVERY BOOK: Freq: Once | Status: DC

## 2023-03-19 MED ORDER — DM-GUAIFENESIN ER 30-600 MG PO TB12: 1.0000 | ORAL_TABLET | Freq: Two times a day (BID) | ORAL | Status: DC | PRN

## 2023-03-19 MED ORDER — ONDANSETRON HCL 4 MG/2ML IJ SOLN
4.0000 mg | Freq: Three times a day (TID) | INTRAMUSCULAR | Status: DC | PRN
  Administered 2023-03-19: 4 mg via INTRAVENOUS
  Filled 2023-03-19: qty 2

## 2023-03-19 MED ORDER — ACETAMINOPHEN 325 MG PO TABS: 650.0000 mg | ORAL_TABLET | Freq: Four times a day (QID) | ORAL | Status: DC | PRN

## 2023-03-19 MED ORDER — POTASSIUM CHLORIDE 10 MEQ/100ML IV SOLN
10.0000 meq | Freq: Once | INTRAVENOUS | Status: AC
  Administered 2023-03-19: 10 meq via INTRAVENOUS
  Filled 2023-03-19: qty 100

## 2023-03-19 MED ORDER — OXYCODONE-ACETAMINOPHEN 5-325 MG PO TABS: 1.0000 | ORAL_TABLET | ORAL | Status: DC | PRN

## 2023-03-19 MED ORDER — ACETAMINOPHEN 325 MG PO TABS: 650.0000 mg | ORAL_TABLET | ORAL | Status: DC | PRN

## 2023-03-19 MED ORDER — ADULT MULTIVITAMIN W/MINERALS CH
1.0000 | ORAL_TABLET | Freq: Every day | ORAL | Status: DC
  Filled 2023-03-19: qty 1

## 2023-03-19 MED ORDER — LIDOCAINE 5 % EX PTCH
1.0000 | MEDICATED_PATCH | CUTANEOUS | Status: DC
  Administered 2023-03-19 – 2023-03-25 (×7): 1 via TRANSDERMAL
  Filled 2023-03-19 (×7): qty 1

## 2023-03-19 MED ORDER — THIAMINE HCL 100 MG/ML IJ SOLN
100.0000 mg | Freq: Every day | INTRAMUSCULAR | Status: DC
  Administered 2023-03-19 – 2023-03-20 (×2): 100 mg via INTRAVENOUS
  Filled 2023-03-19 (×2): qty 2

## 2023-03-19 MED ORDER — ALBUTEROL SULFATE (2.5 MG/3ML) 0.083% IN NEBU: 2.5000 mg | INHALATION_SOLUTION | RESPIRATORY_TRACT | Status: DC | PRN

## 2023-03-19 MED ORDER — SENNOSIDES-DOCUSATE SODIUM 8.6-50 MG PO TABS: 1.0000 | ORAL_TABLET | Freq: Every evening | ORAL | Status: DC | PRN

## 2023-03-19 MED ORDER — GADOBUTROL 1 MMOL/ML IV SOLN
9.0000 mL | Freq: Once | INTRAVENOUS | Status: AC | PRN
  Administered 2023-03-19: 9 mL via INTRAVENOUS

## 2023-03-19 MED ORDER — ACETAMINOPHEN 160 MG/5ML PO SOLN: 650.0000 mg | ORAL | Status: DC | PRN

## 2023-03-19 NOTE — ED Notes (Signed)
 This RN is keeping pt NPO due to increased drowsiness and current diagnosis of aspiration pneumonia s/p seizure activity today. Pt offered fluids for swallow screen but due to drowsiness unable to keep swallowing as instructed with swallow screen and unable to follow directions with screen at this time. For patient safety, patient is NPO at this time.
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
## 2023-03-19 NOTE — Progress Notes (Signed)
 Discussed with ED physician, 73 year old male with left-sided deficits x 48 hours who had a witnessed episode highly concerning for seizure while in the emergency department.  It consisted of lipsmacking with behavioral arrest and gaze deviation.  He does have a history of alcohol abuse, but alcohol withdrawal seizures should not cause focal deficits nor focal seizures.  Given what has been seen, I would favor starting Keppra and admission for MRI and EEG.  I would load with 1500 mg followed by 500 mg twice daily of Keppra.  Full consult to follow in the morning.  Ritta Slot, MD [facility] [phone]  If 7pm- 7am, please page neurology on call as listed in AMION.
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
## 2023-03-19 NOTE — ED Notes (Signed)
 Pt is back into room, VSS. Pt is more responsive from episode but not as alert as when he initially presented to ER with EMS.

## 2023-03-19 NOTE — ED Notes (Addendum)
 CT was at bedside getting ready to take him for his scan, this RN came in to give his tylenol and lidocaine before leaving. Swallow screen was performed but water fell from patients mouth. This RN stepped out the room for a second to grab a straw to retry screen, when coming back into room Pt stopped talking or following commands. CT personnel stated Pt just randomly stopped responding. Pt did not respond to verbal or painful stimuli. MD Tan notified. MD stated to take Pt STAT to CT.

## 2023-03-19 NOTE — ED Notes (Signed)
 Admitting provider contacted this RN via secure chat in regards to patient recent scan during this ED visit. Per provider, a splenic hematoma was noted and to not give blood thinning agents such as aspirin or lovenox. Provider Dc'd orders and this RN did not give. See eMar for more details.

## 2023-03-19 NOTE — ED Notes (Signed)
 Pt had another episode where he became unresponsive to verbal or painful stimuli, R sided gaze noted, Pt is diaphoretic and grunting. MD to bedside. Episode last about a min, Pt came back to and speaking but not fully alert. Pt is able to follow some commands at this time
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
## 2023-03-19 NOTE — ED Notes (Signed)
 Pt headed to CT @this  time, this RN accompanied Pt

## 2023-03-19 NOTE — ED Notes (Signed)
 This Registered Nurse (RN) assumed responsibility for the care of the assigned patient at 1900 on 03/19/23. All nursing tasks, documentation, and responsibilities prior to this time are not the responsibility of this RN. Any assessments, interventions, or documentation completed before this time are not within the scope of this RN's duties.  Effective 1900 on 03/19/23, this RN is now accountable for all aspects of patient care, including but not limited to assessments, interventions, documentation, medication administration, and care coordination. All future tasks and documentation will be managed and completed by this RN from this time until care handoff at 0700 on 03/20/23.

## 2023-03-19 NOTE — ED Notes (Signed)
 Pt de-satted to 85% on RA, placed on 2L [place], O2 increased to 93-97%

## 2023-03-20 ENCOUNTER — Ambulatory Visit: Payer: Medicare HMO

## 2023-03-20 DIAGNOSIS — R4189 Other symptoms and signs involving cognitive functions and awareness: Secondary | ICD-10-CM | POA: Diagnosis not present

## 2023-03-20 DIAGNOSIS — I639 Cerebral infarction, unspecified: Secondary | ICD-10-CM | POA: Diagnosis not present

## 2023-03-20 DIAGNOSIS — D735 Infarction of spleen: Secondary | ICD-10-CM | POA: Diagnosis present

## 2023-03-20 DIAGNOSIS — E876 Hypokalemia: Secondary | ICD-10-CM | POA: Diagnosis not present

## 2023-03-20 DIAGNOSIS — N179 Acute kidney failure, unspecified: Secondary | ICD-10-CM | POA: Diagnosis not present

## 2023-03-20 DIAGNOSIS — F10931 Alcohol use, unspecified with withdrawal delirium: Secondary | ICD-10-CM

## 2023-03-20 DIAGNOSIS — R569 Unspecified convulsions: Secondary | ICD-10-CM | POA: Diagnosis not present

## 2023-03-20 LAB — BASIC METABOLIC PANEL
Anion gap: 9 (ref 5–15)
BUN: 20 mg/dL (ref 8–23)
CO2: 24 mmol/L (ref 22–32)
Calcium: 8.1 mg/dL — ABNORMAL LOW (ref 8.9–10.3)
Chloride: 101 mmol/L (ref 98–111)
Creatinine, Ser: 1.61 mg/dL — ABNORMAL HIGH (ref 0.61–1.24)
GFR, Estimated: 45 mL/min — ABNORMAL LOW (ref >60)
Glucose, Bld: 103 mg/dL — ABNORMAL HIGH (ref 70–99)
Potassium: 3.5 mmol/L (ref 3.5–5.1)
Sodium: 134 mmol/L — ABNORMAL LOW (ref 135–145)

## 2023-03-20 LAB — URINALYSIS, W/ REFLEX TO CULTURE (INFECTION SUSPECTED)
Bacteria, UA: NONE SEEN
Bilirubin Urine: NEGATIVE
Glucose, UA: NEGATIVE mg/dL
Hgb urine dipstick: NEGATIVE
Ketones, ur: NEGATIVE mg/dL
Leukocytes,Ua: NEGATIVE
Nitrite: NEGATIVE
Protein, ur: NEGATIVE mg/dL
Specific Gravity, Urine: >1.046 — ABNORMAL HIGH (ref 1.005–1.030)
Squamous Epithelial / HPF: 0 /HPF (ref 0–5)
pH: 5 (ref 5.0–8.0)

## 2023-03-20 LAB — CBC
HCT: 32.3 % — ABNORMAL LOW (ref 39.0–52.0)
HCT: 26.4 % — ABNORMAL LOW (ref 39.0–52.0)
Hemoglobin: 11.3 g/dL — ABNORMAL LOW (ref 13.0–17.0)
Hemoglobin: 9.2 g/dL — ABNORMAL LOW (ref 13.0–17.0)
MCH: 33.2 pg (ref 26.0–34.0)
MCH: 33.9 pg (ref 26.0–34.0)
MCHC: 35 g/dL (ref 30.0–36.0)
MCHC: 34.8 g/dL (ref 30.0–36.0)
MCV: 95 fL (ref 80.0–100.0)
MCV: 97.4 fL (ref 80.0–100.0)
Platelets: 196 10*3/uL (ref 150–400)
Platelets: 201 10*3/uL (ref 150–400)
RBC: 3.4 MIL/uL — ABNORMAL LOW (ref 4.22–5.81)
RBC: 2.71 MIL/uL — ABNORMAL LOW (ref 4.22–5.81)
RDW: 12.2 % (ref 11.5–15.5)
RDW: 12.5 % (ref 11.5–15.5)
WBC: 9.9 10*3/uL (ref 4.0–10.5)
WBC: 13.9 10*3/uL — ABNORMAL HIGH (ref 4.0–10.5)
nRBC: 0 % (ref 0.0–0.2)
nRBC: 0 % (ref 0.0–0.2)

## 2023-03-20 LAB — LIPID PANEL
Cholesterol: 203 mg/dL — ABNORMAL HIGH (ref 0–200)
HDL: 30 mg/dL — ABNORMAL LOW (ref >40)
LDL Cholesterol: 148 mg/dL — ABNORMAL HIGH (ref 0–99)
Total CHOL/HDL Ratio: 6.8 ratio
Triglycerides: 124 mg/dL (ref <150)
VLDL: 25 mg/dL (ref 0–40)

## 2023-03-20 LAB — MAGNESIUM: Magnesium: 2.6 mg/dL — ABNORMAL HIGH (ref 1.7–2.4)

## 2023-03-20 LAB — PROTIME-INR
INR: 1.1 (ref 0.8–1.2)
Prothrombin Time: 14.1 s (ref 11.4–15.2)

## 2023-03-20 LAB — URINE DRUG SCREEN, QUALITATIVE (ARMC ONLY)
Amphetamines, Ur Screen: NOT DETECTED
Barbiturates, Ur Screen: NOT DETECTED
Benzodiazepine, Ur Scrn: NOT DETECTED
Cannabinoid 50 Ng, Ur ~~LOC~~: NOT DETECTED
Cocaine Metabolite,Ur ~~LOC~~: POSITIVE — AB
MDMA (Ecstasy)Ur Screen: NOT DETECTED
Methadone Scn, Ur: NOT DETECTED
Opiate, Ur Screen: POSITIVE — AB
Phencyclidine (PCP) Ur S: NOT DETECTED
Tricyclic, Ur Screen: POSITIVE — AB

## 2023-03-20 LAB — HEMOGLOBIN A1C
Hgb A1c MFr Bld: 5.2 % (ref 4.8–5.6)
Mean Plasma Glucose: 102.54 mg/dL

## 2023-03-20 LAB — APTT: aPTT: 26 s (ref 24–36)

## 2023-03-20 MED ORDER — METOPROLOL TARTRATE 25 MG PO TABS
25.0000 mg | ORAL_TABLET | Freq: Two times a day (BID) | ORAL | Status: DC
  Filled 2023-03-20: qty 1

## 2023-03-20 MED ORDER — MORPHINE SULFATE (PF) 2 MG/ML IV SOLN
2.0000 mg | INTRAVENOUS | Status: DC | PRN
  Administered 2023-03-20 – 2023-03-21 (×6): 2 mg via INTRAVENOUS
  Filled 2023-03-20 (×6): qty 1

## 2023-03-20 MED ORDER — ATORVASTATIN CALCIUM 80 MG PO TABS
80.0000 mg | ORAL_TABLET | Freq: Every day | ORAL | Status: DC
  Filled 2023-03-20: qty 4

## 2023-03-20 MED ORDER — SODIUM CHLORIDE 0.9 % IV BOLUS
1000.0000 mL | Freq: Once | INTRAVENOUS | Status: AC
  Administered 2023-03-20: 1000 mL via INTRAVENOUS

## 2023-03-20 MED ORDER — MAGNESIUM SULFATE 2 GM/50ML IV SOLN
2.0000 g | Freq: Once | INTRAVENOUS | Status: AC
  Administered 2023-03-20: 2 g via INTRAVENOUS
  Filled 2023-03-20: qty 50
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
## 2023-03-20 NOTE — Progress Notes (Signed)
 Eeg done

## 2023-03-20 NOTE — Progress Notes (Signed)
 SLP Cancellation Note  Patient Details Name: Marc Fischer MRN: [id number] DOB: 1950/08/18   Cancelled treatment:        Chart reviewed. In light of surgery notes recommending continued NPO for now and Pt lethargy, will hold off on swallow eval for now. ST to follow up tomorrow in hopes of ability to participate.   [name] 03/20/2023, 1:32 PM
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
## 2023-03-20 NOTE — ED Notes (Signed)
 Patient placed on 3L nasal cannula due to O2 of 85%. O2 currently 93% on 3L.

## 2023-03-20 NOTE — TOC CM/SW Note (Signed)
 CSW acknowledges SNF recommendation. Per chart review- patient is disoriented and not able to participate in SNF work up at this time. TOC will follow up when medically appropriate. Handoff updated.  Alfonso Ramus, LCSW Transitions of Care Department [phone]
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
## 2023-03-20 NOTE — ED Notes (Signed)
 Pt is more alert now, able to voice concerns. Pt requested water and scd removal. Swallow screen perform, passed. Scd removed per pt request. Pt oriented x3. Mittens were removed and pt has not removed any lines or wires.

## 2023-03-20 NOTE — ED Notes (Signed)
 Patient found to have removed both Ivs and all monitoring devices. Pt incontinent of urine. Bed linens changed. Fresh gown on patient. Mitts placed on patient at this time.
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
## 2023-03-20 NOTE — Procedures (Signed)
 History: 73 year old male with episode concerning for seizure  Sedation: None  Patient State: Awake and drowsy  Technique: This EEG was acquired with electrodes placed according to the International 10-20 electrode system (including Fp1, Fp2, F3, F4, C3, C4, P3, P4, O1, O2, T3, T4, T5, T6, A1, A2, Fz, Cz, Pz). The following electrodes were missing or displaced: none.   Background: There is a brief period of awakening during which the patient has a posterior dominant rhythm of 10 Hz.  The predominance of this EEG, however, is in drowsiness and sleep with symmetrically appearing normal structures.  There was no epileptiform discharge seen.  Photic stimulation: Physiologic driving is not performed  EEG Abnormalities: None  Clinical Interpretation: This normal EEG is recorded in the waking and sleep state. There was no seizure or seizure predisposition recorded on this study. Please note that lack of epileptiform activity on EEG does not preclude the possibility of epilepsy.   Ritta Slot, MD [facility] [phone]  If 7pm- 7am, please page neurology on call as listed in AMION.
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
## 2023-03-21 ENCOUNTER — Inpatient Hospital Stay (HOSPITAL_COMMUNITY)
Admit: 2023-03-21 | Discharge: 2023-03-21 | Disposition: A | Discharge status: Home / Self Care | Payer: Medicare HMO | Attending: Internal Medicine | Admitting: Internal Medicine

## 2023-03-21 ENCOUNTER — Inpatient Hospital Stay: Payer: Medicare HMO

## 2023-03-21 DIAGNOSIS — I639 Cerebral infarction, unspecified: Secondary | ICD-10-CM

## 2023-03-21 DIAGNOSIS — D735 Infarction of spleen: Secondary | ICD-10-CM | POA: Diagnosis not present

## 2023-03-21 DIAGNOSIS — I6389 Other cerebral infarction: Secondary | ICD-10-CM

## 2023-03-21 LAB — COMPREHENSIVE METABOLIC PANEL
ALT: 15 U/L (ref 0–44)
AST: 56 U/L — ABNORMAL HIGH (ref 15–41)
Albumin: 3.2 g/dL — ABNORMAL LOW (ref 3.5–5.0)
Alkaline Phosphatase: 53 U/L (ref 38–126)
Anion gap: 10 (ref 5–15)
BUN: 33 mg/dL — ABNORMAL HIGH (ref 8–23)
CO2: 21 mmol/L — ABNORMAL LOW (ref 22–32)
Calcium: 8.3 mg/dL — ABNORMAL LOW (ref 8.9–10.3)
Chloride: 106 mmol/L (ref 98–111)
Creatinine, Ser: 1.4 mg/dL — ABNORMAL HIGH (ref 0.61–1.24)
GFR, Estimated: 53 mL/min — ABNORMAL LOW (ref >60)
Glucose, Bld: 123 mg/dL — ABNORMAL HIGH (ref 70–99)
Potassium: 3.3 mmol/L — ABNORMAL LOW (ref 3.5–5.1)
Sodium: 137 mmol/L (ref 135–145)
Total Bilirubin: 0.7 mg/dL (ref 0.0–1.2)
Total Protein: 5.9 g/dL — ABNORMAL LOW (ref 6.5–8.1)

## 2023-03-21 LAB — CBC
HCT: 24.7 % — ABNORMAL LOW (ref 39.0–52.0)
HCT: 26.9 % — ABNORMAL LOW (ref 39.0–52.0)
Hemoglobin: 8.5 g/dL — ABNORMAL LOW (ref 13.0–17.0)
Hemoglobin: 9.4 g/dL — ABNORMAL LOW (ref 13.0–17.0)
MCH: 33.6 pg (ref 26.0–34.0)
MCH: 33.6 pg (ref 26.0–34.0)
MCHC: 34.4 g/dL (ref 30.0–36.0)
MCHC: 34.9 g/dL (ref 30.0–36.0)
MCV: 96.1 fL (ref 80.0–100.0)
MCV: 97.6 fL (ref 80.0–100.0)
Platelets: 187 10*3/uL (ref 150–400)
Platelets: 209 10*3/uL (ref 150–400)
RBC: 2.53 MIL/uL — ABNORMAL LOW (ref 4.22–5.81)
RBC: 2.8 MIL/uL — ABNORMAL LOW (ref 4.22–5.81)
RDW: 12.7 % (ref 11.5–15.5)
RDW: 12.7 % (ref 11.5–15.5)
WBC: 14.1 10*3/uL — ABNORMAL HIGH (ref 4.0–10.5)
WBC: 15.7 10*3/uL — ABNORMAL HIGH (ref 4.0–10.5)
nRBC: 0 % (ref 0.0–0.2)
nRBC: 0 % (ref 0.0–0.2)

## 2023-03-21 LAB — BASIC METABOLIC PANEL
Anion gap: 12 (ref 5–15)
BUN: 31 mg/dL — ABNORMAL HIGH (ref 8–23)
CO2: 20 mmol/L — ABNORMAL LOW (ref 22–32)
Calcium: 8.1 mg/dL — ABNORMAL LOW (ref 8.9–10.3)
Chloride: 105 mmol/L (ref 98–111)
Creatinine, Ser: 1.92 mg/dL — ABNORMAL HIGH (ref 0.61–1.24)
GFR, Estimated: 37 mL/min — ABNORMAL LOW (ref >60)
Glucose, Bld: 127 mg/dL — ABNORMAL HIGH (ref 70–99)
Potassium: 3.4 mmol/L — ABNORMAL LOW (ref 3.5–5.1)
Sodium: 137 mmol/L (ref 135–145)

## 2023-03-21 LAB — HEMOGLOBIN AND HEMATOCRIT, BLOOD
HCT: 23.1 % — ABNORMAL LOW (ref 39.0–52.0)
Hemoglobin: 7.8 g/dL — ABNORMAL LOW (ref 13.0–17.0)

## 2023-03-21 LAB — MAGNESIUM: Magnesium: 2.1 mg/dL (ref 1.7–2.4)

## 2023-03-21 LAB — BLOOD GAS, VENOUS
Acid-base deficit: 0.7 mmol/L (ref 0.0–2.0)
Bicarbonate: 24.2 mmol/L (ref 20.0–28.0)
O2 Saturation: 71.5 %
Patient temperature: 37
pCO2, Ven: 40 mmHg — ABNORMAL LOW (ref 44–60)
pH, Ven: 7.39 (ref 7.25–7.43)
pO2, Ven: 44 mmHg (ref 32–45)

## 2023-03-21 LAB — ECHOCARDIOGRAM COMPLETE
AV Peak grad: 9.6 mmHg
Ao pk vel: 1.55 m/s
Area-P 1/2: 4.41 cm2
Height: 70 in
S' Lateral: 3.5 cm
Weight: 3392 [oz_av]

## 2023-03-21 LAB — AMMONIA: Ammonia: 31 umol/L (ref 9–35)

## 2023-03-21 LAB — VITAMIN B12: Vitamin B-12: 200 pg/mL (ref 180–914)

## 2023-03-21 MED ORDER — LORAZEPAM 2 MG/ML IJ SOLN
1.0000 mg | INTRAMUSCULAR | Status: DC | PRN
Start: 2023-03-21 — End: 2023-03-24

## 2023-03-21 MED ORDER — PHENOBARBITAL SODIUM 130 MG/ML IJ SOLN
130.0000 mg | Freq: Once | INTRAMUSCULAR | Status: AC
  Administered 2023-03-21: 130 mg via INTRAVENOUS
  Filled 2023-03-21: qty 1

## 2023-03-21 MED ORDER — VITAMIN B-12 1000 MCG PO TABS
1000.0000 ug | ORAL_TABLET | Freq: Every day | ORAL | Status: DC
  Filled 2023-03-21: qty 2

## 2023-03-21 MED ORDER — POTASSIUM CHLORIDE 10 MEQ/100ML IV SOLN
10.0000 meq | INTRAVENOUS | Status: AC
  Administered 2023-03-22 (×4): 10 meq via INTRAVENOUS
  Filled 2023-03-21 (×2): qty 100

## 2023-03-21 MED ORDER — METOPROLOL TARTRATE 5 MG/5ML IV SOLN
5.0000 mg | Freq: Once | INTRAVENOUS | Status: AC
  Administered 2023-03-21: 5 mg via INTRAVENOUS
  Filled 2023-03-21: qty 5

## 2023-03-21 MED ORDER — PHENOBARBITAL SODIUM 130 MG/ML IJ SOLN
97.5000 mg | Freq: Three times a day (TID) | INTRAMUSCULAR | Status: DC
  Administered 2023-03-22 – 2023-03-23 (×4): 97.5 mg via INTRAVENOUS
  Filled 2023-03-21 (×4): qty 1

## 2023-03-21 MED ORDER — MORPHINE SULFATE (PF) 4 MG/ML IV SOLN
4.0000 mg | Freq: Once | INTRAVENOUS | Status: AC
  Administered 2023-03-21: 4 mg via INTRAVENOUS
  Filled 2023-03-21: qty 1

## 2023-03-21 MED ORDER — AMLODIPINE BESYLATE 5 MG PO TABS
5.0000 mg | ORAL_TABLET | Freq: Every day | ORAL | Status: DC
  Filled 2023-03-21: qty 1

## 2023-03-21 MED ORDER — PHENOBARBITAL SODIUM 65 MG/ML IJ SOLN: 32.5000 mg | Freq: Three times a day (TID) | INTRAMUSCULAR | Status: DC

## 2023-03-21 MED ORDER — PHENOBARBITAL SODIUM 65 MG/ML IJ SOLN: 65.0000 mg | Freq: Three times a day (TID) | INTRAMUSCULAR | Status: DC

## 2023-03-21 MED ORDER — KCL IN DEXTROSE-NACL 40-5-0.9 MEQ/L-%-% IV SOLN
INTRAVENOUS | Status: DC
  Filled 2023-03-21 (×2): qty 1000

## 2023-03-21 NOTE — ED Notes (Signed)
 Holding prn ativan at this time. Patient having periods of agitation, likely due to pain. Patient pain treated. Will follow up.
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
## 2023-03-21 NOTE — Progress Notes (Addendum)
    Subjective  -   Patient denies any abdominal pain   Physical Exam:  Abdomen is soft and nontender to palpation       Assessment/Plan:    Stroke: The patient's most likely explanation is his carotid disease.  Treatment is somewhat complicated by his subcapsular splenic hematoma.  In order to appropriately address his carotid disease, he will need to have some form of antiplatelet therapy which could lead to complications from the splenic hematoma.  Therefore the plan is to delay treatment of his carotid disease for few weeks.  Splenic hematoma: This was visualized on a noncontrasted CT scan that showed a subcapsular hematoma.  He has not required blood transfusion and denies any pain.  However because of his underlying carotid disease, we should probably get better evaluation of his splenic injury with a CT angiogram.  However, his creatinine has increased to 1.9 today and so I will recommend hydrating him overnight and reevaluating this tomorrow.  There is a possibility that he may require splenic artery embolization just so that he can be on the appropriate medications to treat his carotid disease.  Based on the results of the CT scan and whether or not he needs splenic artery embolization, consideration should be given to going ahead and vaccinating him as if he were asplenic.  Wells Marc Fischer 03/21/2023 1:34 PM --  Vitals:   03/21/23 1130 03/21/23 1313  BP: (!) 164/99 (!) 148/77  Pulse: 92 93  Resp: (!) 25   Temp:    SpO2: 91%     Intake/Output Summary (Last 24 hours) at 03/21/2023 1334 Last data filed at 03/20/2023 2124 Gross per 24 hour  Intake 1678.84 ml  Output --  Net 1678.84 ml     Laboratory CBC    Component Value Date/Time   WBC 14.1 (H) 03/21/2023 0556   HGB 8.5 (L) 03/21/2023 0556   HCT 24.7 (L) 03/21/2023 0556   PLT 187 03/21/2023 0556    BMET    Component Value Date/Time   NA 137 03/21/2023 0556   K 3.4 (L) 03/21/2023 0556   CL 105 03/21/2023  0556   CO2 20 (L) 03/21/2023 0556   GLUCOSE 127 (H) 03/21/2023 0556   BUN 31 (H) 03/21/2023 0556   CREATININE 1.92 (H) 03/21/2023 0556   CALCIUM 8.1 (L) 03/21/2023 0556   GFRNONAA 37 (L) 03/21/2023 0556    COAG Lab Results  Component Value Date   INR 1.1 03/19/2023   No results found for: "PTT"  Antibiotics Anti-infectives (From admission, onward)    Start     Dose/Rate Route Frequency Ordered Stop   03/19/23 2200  Ampicillin-Sulbactam (UNASYN) 3 g in sodium chloride 0.9 % 100 mL IVPB        3 g 200 mL/hr over 30 Minutes Intravenous Every 6 hours 03/19/23 2115          V. Charlena Cross, M.D., [facility] [place] Office: [phone] Pager:  [phone]
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
## 2023-03-21 NOTE — ED Notes (Signed)
 Hourly rounding on pt; pt pulled out LAC IV and wires off and [place] off. Pt states he was hallucinating. Pt reoriented. Pt hooked back to monitor. One IV remains. Oxygen placed back on pt.

## 2023-03-21 NOTE — Plan of Care (Signed)
 NEUROLOGY CONSULT FOLLOW UP NOTE   Date of service: March 21, 2023 Patient Name: Marc Fischer MRN:  [id number] DOB:  04-25-1950  Interval Hx/subjective   - Frequent agitation /  need for Ativan for withdrawal has complicated neurochecks  Vitals   Current vital signs: BP 92/73   Pulse 84   Temp (!) 97.4 F (36.3 C)   Resp (!) 22   Ht 5\' 10"  (1.778 m)   Wt 96.2 kg   SpO2 94%   BMI 30.42 kg/m  Vital signs in last 24 hours: Temp:  [97.4 F (36.3 C)-99 F (37.2 C)] 97.4 F (36.3 C) (02/01 1427) Pulse Rate:  [83-99] 84 (02/01 1536) Resp:  [16-26] 22 (02/01 1536) BP: (92-183)/(70-99) 92/73 (02/01 1536) SpO2:  [91 %-98 %] 94 % (02/01 1536)  Vitals:   03/21/23 1130 03/21/23 1313 03/21/23 1427 03/21/23 1536  BP: (!) 164/99 (!) 148/77  92/73  Pulse: 92 93  84  Resp: (!) 25   (!) 22  Temp:   (!) 97.4 F (36.3 C)   TempSrc:      SpO2: 91%   94%  Weight:      Height:         Body mass index is 30.42 kg/m.  Physical Exam   Constitutional: Appears chronically ill  Psych: Affect flat Eyes: No scleral injection.  HENT: No OP obstruction, Normocephalic.  Cardiovascular: Normal rate and regular rhythm.  Respiratory: Effort normal, non-labored breathing.  GI: Distended and somewhat firm   Neurologic Examination   Neuro: Mental Status: Patient recently given ativan, extremely dysarthric and difficult to understand, but able to name thumb, little finger, insists he's 73, not 73, knows it is Feb ranial Nerves: II: Pupils are equal, round, and reactive to light.   III,IV, VI: Orients to voice bilaterally V: Facial sensation was reduced on the left on my colleagues' exam when awake enough to assess; equal blink to eyelash brush on my exam today VII: Facial movement is symmetric on grimace.  VIII: hearing is intact to voice Motor: On exam 1/31: 3/5 left arm strength 4/5 left leg strength, 5/5 on the right  Today, grossly using left side less than the right, but moves  all four to noxious stim, less briskly on the left than the right Cerebellar: Unable to assess secondary to patient's mental status   Medications  Current Facility-Administered Medications:     stroke: early stages of recovery book, , Does not apply, Once, Lorretta Harp, MD   acetaminophen (TYLENOL) tablet 650 mg, 650 mg, Oral, Q4H PRN **OR** [DISCONTINUED] acetaminophen (TYLENOL) 160 MG/5ML solution 650 mg, 650 mg, Per Tube, Q4H PRN **OR** [DISCONTINUED] acetaminophen (TYLENOL) suppository 650 mg, 650 mg, Rectal, Q4H PRN, Lorretta Harp, MD   albuterol (PROVENTIL) (2.5 MG/3ML) 0.083% nebulizer solution 2.5 mg, 2.5 mg, Nebulization, Q4H PRN, Lorretta Harp, MD   amLODipine (NORVASC) tablet 5 mg, 5 mg, Oral, Daily, Lurene Shadow, MD   Ampicillin-Sulbactam (UNASYN) 3 g in sodium chloride 0.9 % 100 mL IVPB, 3 g, Intravenous, Q6H, Lorretta Harp, MD, Stopped at 03/21/23 1152   atorvastatin (LIPITOR) tablet 80 mg, 80 mg, Oral, Daily, Lorretta Harp, MD   dextromethorphan-guaiFENesin (MUCINEX DM) 30-600 MG per 12 hr tablet 1 tablet, 1 tablet, Oral, BID PRN, Lorretta Harp, MD   dextrose 5 % and 0.9 % NaCl with KCl 40 mEq/L infusion, , Intravenous, Continuous, Lurene Shadow, MD, Last Rate: 75 mL/hr at 03/21/23 1426, New Bag at 03/21/23 1426   folic acid (FOLVITE) tablet  1 mg, 1 mg, Oral, Daily, Tan, Franchot Erichsen, MD   hydrALAZINE (APRESOLINE) injection 5 mg, 5 mg, Intravenous, Q2H PRN, Lorretta Harp, MD, 5 mg at 03/21/23 0630   lidocaine (LIDODERM) 5 % 1 patch, 1 patch, Transdermal, Q24H, Tan, Franchot Erichsen, MD, 1 patch at 03/20/23 1659   LORazepam (ATIVAN) tablet 1-4 mg, 1-4 mg, Oral, Q1H PRN **OR** LORazepam (ATIVAN) injection 1-4 mg, 1-4 mg, Intravenous, Q1H PRN, Claybon Jabs, MD, 3 mg at 03/21/23 1454   LORazepam (ATIVAN) injection 2 mg, 2 mg, Intravenous, Q2H PRN, Lorretta Harp, MD   metoprolol tartrate (LOPRESSOR) tablet 25 mg, 25 mg, Oral, BID, Lorretta Harp, MD   morphine (PF) 2 MG/ML injection 2 mg, 2 mg, Intravenous, Q4H PRN, Lorretta Harp, MD, 2 mg at 03/21/23 1415   multivitamin with minerals tablet 1 tablet, 1 tablet, Oral, Daily, Tan, Franchot Erichsen, MD   nicotine (NICODERM CQ - dosed in mg/24 hours) patch 21 mg, 21 mg, Transdermal, Daily, Lorretta Harp, MD   ondansetron [facility]) injection 4 mg, 4 mg, Intravenous, Q8H PRN, Lorretta Harp, MD, 4 mg at 03/19/23 2019   oxyCODONE-acetaminophen (PERCOCET/ROXICET) 5-325 MG per tablet 1 tablet, 1 tablet, Oral, Q4H PRN, Lorretta Harp, MD   senna-docusate (Senokot-S) tablet 1 tablet, 1 tablet, Oral, QHS PRN, Lorretta Harp, MD   thiamine (VITAMIN B1) tablet 100 mg, 100 mg, Oral, Daily **OR** thiamine (VITAMIN B1) injection 100 mg, 100 mg, Intravenous, Daily, Claybon Jabs, MD, 100 mg at 03/20/23 1129 No current outpatient medications on file.  Labs and Diagnostic Imaging    Basic Metabolic Panel: Recent Labs  Lab 03/19/23 1659 03/19/23 1701 03/20/23 0514 03/21/23 0556  NA 133*  --  134* 137  K 3.1*  --  3.5 3.4*  CL 99  --  101 105  CO2 19*  --  24 20*  GLUCOSE 125*  --  103* 127*  BUN 14  --  20 31*  CREATININE 1.41*  --  1.61* 1.92*  CALCIUM 8.3*  --  8.1* 8.1*  MG  --  1.4* 2.6*  --   PHOS  --  3.6  --   --     CBC: Recent Labs  Lab 03/19/23 1659 03/19/23 2322 03/20/23 0514 03/20/23 1720 03/20/23 2333 03/21/23 0556  WBC 13.0* 9.8 9.9 13.9* 15.7* 14.1*  NEUTROABS 10.0*  --   --   --   --   --   HGB 14.2 12.0* 11.3* 9.2* 9.4* 8.5*  HCT 40.6 34.9* 32.3* 26.4* 26.9* 24.7*  MCV 95.1 96.1 95.0 97.4 96.1 97.6  PLT 225 197 196 201 209 187    Coagulation Studies: Recent Labs    03/19/23 1720  LABPROT 14.1  INR 1.1      Lipid Panel:  Lab Results  Component Value Date   CHOL 203 (H) 03/20/2023   HDL 30 (L) 03/20/2023   LDLCALC 148 (H) 03/20/2023   TRIG 124 03/20/2023   CHOLHDL 6.8 03/20/2023    HgbA1c:  Lab Results  Component Value Date   HGBA1C 5.2 03/20/2023   Urine Drug Screen:     Component Value Date/Time   LABOPIA POSITIVE (A) 03/20/2023 0555    COCAINSCRNUR POSITIVE (A) 03/20/2023 0555   LABBENZ NONE DETECTED 03/20/2023 0555   AMPHETMU NONE DETECTED 03/20/2023 0555   THCU NONE DETECTED 03/20/2023 0555   LABBARB NONE DETECTED 03/20/2023 0555    Alcohol Level     Component Value Date/Time   ETH 35 (H) 03/19/2023 1659  INR  Lab Results  Component Value Date   INR 1.1 03/19/2023   APTT  Lab Results  Component Value Date   APTT 26 03/19/2023   AED levels: No results found for: "PHENYTOIN", "ZONISAMIDE", "LAMOTRIGINE", "LEVETIRACETA"   MRI Brain(Personally reviewed): Right thalamic infarct   CTA 1. No emergent large vessel occlusion. 2. Similar severe left and moderate right intracranial ICA stenosis. 3. Similar approximately 70% left and 60% right proximal ICA stenosis in the neck. 4. Similar moderate to severe left and moderate right vertebral artery origin stenosis. 5. Similar moderate right M1 MCA stenosis 6.  Aortic Atherosclerosis (ICD10-I70.0).  ECHO  1. Left ventricular ejection fraction, by estimation, is 55 to 60%. The  left ventricle has normal function. The left ventricle has no regional  wall motion abnormalities. Left ventricular diastolic parameters are  indeterminate.   2. Right ventricular systolic function is normal. The right ventricular  size is normal.   3. Left atrial size was mildly dilated.   4. The mitral valve is normal in structure. Trivial mitral valve  regurgitation. No evidence of mitral stenosis.   5. The aortic valve has an indeterminant number of cusps. There is mild  calcification of the aortic valve. There is mild thickening of the aortic  valve. Aortic valve regurgitation is not visualized. Aortic valve  sclerosis/calcification is present,  without any evidence of aortic stenosis.   6. Aortic dilatation noted. There is borderline dilatation of the  ascending aorta, measuring 39 mm.   7. The inferior vena cava is normal in size with greater than 50%  respiratory  variability, suggesting right atrial pressure of 3 mmHg.   rEEG:  This normal EEG is recorded in the waking and sleep state. There was no seizure or seizure predisposition recorded on this study. Please note that lack of epileptiform activity on EEG does not preclude the possibility of epilepsy.   Assessment   Eythan Jayne is a 73 y.o. male from whom neurology was consulted for c/f seizure and stroke  Stroke etiology is likely small vessel disease given location and size but certainly could be embolic. Not clear to me that the source is the carotid artery, would consider cardioembolic as well given cocaine use and alcohol use which increase risk of arrhthymias and cardiac dysfunction  Continue to hold off on antiplatelet agents given downtrending hemoglobin  Likely having worsening mental status in the setting of AKI, uremia, downtrending hemogloblin, consider worsening splenic process,   Recommendations  - Empiric B12 supplementation, 1000 mcg daily, okay to discontinue if B12 > 600 and methylmalonic acid level normal - Given ECHO findings of left atrial enlargement, do think event monitor on discharge would be prudent - ASA 81 when cleared from splenic hematoma perspective  - Appreciate management of downtrending hemoglobin by primary team / vascular, as well as management of other comorbidities - Low concern for any underlying epilepsy at this time, continue to hold any anti-seizure medications other than as being used for withdrawal - Neurology will sign off at this time, discussed with vascular surgery and primary team via phone and secure chat ______________________________________________________________________  Brooke Dare MD-PhD [facility] [phone] Available 7 AM to 7 PM, outside these hours please contact Neurologist on call listed on AMION
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
## 2023-03-21 NOTE — Progress Notes (Signed)
       CROSS COVER NOTE  NAME: Marc Fischer MRN: [id number] DOB : 02/09/1951 ATTENDING PHYSICIAN: Lurene Shadow, MD    Date of Service   03/21/2023   HPI/Events of Note   Brief round through [facility] ER nurse informs me patient has bed on 2C but patient remains agitated post ativan admin based on CIWA score. Reports significant worsening of withdrawal symptoms and breathing this evening  Interventions   Assessment/Plan:    03/21/2023    8:30 PM 03/21/2023    7:30 PM 03/21/2023    7:00 PM  Vitals with BMI  Systolic 103 121 191  Diastolic 69 100 86  Pulse 105 97 89    NEURO - speech garbled. Could not participate in NIH eval Did attempt to pick left arm off bed on command, no other commands followed but appears to MAE grossly symmetrically.  He does appear to be actively hallucinating looking at wall , picking in air  Resp - wet upper airways concern for aspiration - had previously past swallow eveal with speech, Resp labored with significant abd muscle use and 30 breaths per min CV - EKG accelerated junctional new ST depression inferiolateral leads more pronounced compared to EKG on 03-30-2023, QT improved Abdomen - signif distension but soft, pain in LUQ with palpation  Review of labs significant for  hemoglobin continues to down trend  X X X

## 2023-03-21 NOTE — ED Notes (Signed)
 Pt's linen soiled w/ urine. Linen changed at this time.

## 2023-03-21 NOTE — ED Notes (Signed)
 Provider at the bedside.

## 2023-03-21 NOTE — ED Notes (Signed)
 Marc Fischer [id number]
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
## 2023-03-21 DEATH — deceased

## 2023-03-22 ENCOUNTER — Inpatient Hospital Stay: Payer: Medicare HMO

## 2023-03-22 DIAGNOSIS — I639 Cerebral infarction, unspecified: Secondary | ICD-10-CM | POA: Diagnosis not present

## 2023-03-22 LAB — RESP PANEL BY RT-PCR (RSV, FLU A&B, COVID)  RVPGX2
Influenza A by PCR: NEGATIVE
Influenza B by PCR: NEGATIVE
Resp Syncytial Virus by PCR: NEGATIVE
SARS Coronavirus 2 by RT PCR: NEGATIVE

## 2023-03-22 LAB — CBC
HCT: 20.3 % — ABNORMAL LOW (ref 39.0–52.0)
Hemoglobin: 7 g/dL — ABNORMAL LOW (ref 13.0–17.0)
MCH: 33.7 pg (ref 26.0–34.0)
MCHC: 34.5 g/dL (ref 30.0–36.0)
MCV: 97.6 fL (ref 80.0–100.0)
Platelets: 166 10*3/uL (ref 150–400)
RBC: 2.08 MIL/uL — ABNORMAL LOW (ref 4.22–5.81)
RDW: 12.8 % (ref 11.5–15.5)
WBC: 11.7 10*3/uL — ABNORMAL HIGH (ref 4.0–10.5)
nRBC: 0 % (ref 0.0–0.2)

## 2023-03-22 LAB — TROPONIN I (HIGH SENSITIVITY)
Troponin I (High Sensitivity): 7174 ng/L (ref <18)
Troponin I (High Sensitivity): 8417 ng/L (ref <18)
Troponin I (High Sensitivity): 9245 ng/L (ref <18)
Troponin I (High Sensitivity): 10484 ng/L (ref <18)

## 2023-03-22 LAB — GLUCOSE, CAPILLARY
Glucose-Capillary: 162 mg/dL — ABNORMAL HIGH (ref 70–99)
Glucose-Capillary: 133 mg/dL — ABNORMAL HIGH (ref 70–99)
Glucose-Capillary: 139 mg/dL — ABNORMAL HIGH (ref 70–99)
Glucose-Capillary: 136 mg/dL — ABNORMAL HIGH (ref 70–99)
Glucose-Capillary: 119 mg/dL — ABNORMAL HIGH (ref 70–99)

## 2023-03-22 LAB — MAGNESIUM: Magnesium: 2.1 mg/dL (ref 1.7–2.4)

## 2023-03-22 LAB — PHOSPHORUS: Phosphorus: 1.9 mg/dL — ABNORMAL LOW (ref 2.5–4.6)

## 2023-03-22 LAB — APTT: aPTT: 29 s (ref 24–36)

## 2023-03-22 LAB — BASIC METABOLIC PANEL
Anion gap: 9 (ref 5–15)
BUN: 28 mg/dL — ABNORMAL HIGH (ref 8–23)
CO2: 20 mmol/L — ABNORMAL LOW (ref 22–32)
Calcium: 8.4 mg/dL — ABNORMAL LOW (ref 8.9–10.3)
Chloride: 111 mmol/L (ref 98–111)
Creatinine, Ser: 1.25 mg/dL — ABNORMAL HIGH (ref 0.61–1.24)
GFR, Estimated: >60 mL/min (ref >60)
Glucose, Bld: 130 mg/dL — ABNORMAL HIGH (ref 70–99)
Potassium: 4.1 mmol/L (ref 3.5–5.1)
Sodium: 140 mmol/L (ref 135–145)

## 2023-03-22 LAB — CK: Total CK: 765 U/L — ABNORMAL HIGH (ref 49–397)

## 2023-03-22 LAB — CBC WITH DIFFERENTIAL/PLATELET
Abs Immature Granulocytes: 0.08 10*3/uL — ABNORMAL HIGH (ref 0.00–0.07)
Basophils Absolute: 0 10*3/uL (ref 0.0–0.1)
Basophils Relative: 0 %
Eosinophils Absolute: 0 10*3/uL (ref 0.0–0.5)
Eosinophils Relative: 0 %
HCT: 22.8 % — ABNORMAL LOW (ref 39.0–52.0)
Hemoglobin: 7.6 g/dL — ABNORMAL LOW (ref 13.0–17.0)
Immature Granulocytes: 1 %
Lymphocytes Relative: 5 %
Lymphs Abs: 0.5 10*3/uL — ABNORMAL LOW (ref 0.7–4.0)
MCH: 33.3 pg (ref 26.0–34.0)
MCHC: 33.3 g/dL (ref 30.0–36.0)
MCV: 100 fL (ref 80.0–100.0)
Monocytes Absolute: 0.4 10*3/uL (ref 0.1–1.0)
Monocytes Relative: 4 %
Neutro Abs: 10.3 10*3/uL — ABNORMAL HIGH (ref 1.7–7.7)
Neutrophils Relative %: 90 %
Platelets: 171 10*3/uL (ref 150–400)
RBC: 2.28 MIL/uL — ABNORMAL LOW (ref 4.22–5.81)
RDW: 13.1 % (ref 11.5–15.5)
WBC: 11.4 10*3/uL — ABNORMAL HIGH (ref 4.0–10.5)
nRBC: 0 % (ref 0.0–0.2)

## 2023-03-22 LAB — ABO/RH: ABO/RH(D): O POS

## 2023-03-22 LAB — PROTIME-INR
INR: 1.1 (ref 0.8–1.2)
Prothrombin Time: 14.4 s (ref 11.4–15.2)

## 2023-03-22 LAB — HEMOGLOBIN
Hemoglobin: 8.1 g/dL — ABNORMAL LOW (ref 13.0–17.0)
Hemoglobin: 7.7 g/dL — ABNORMAL LOW (ref 13.0–17.0)
Hemoglobin: 7.7 g/dL — ABNORMAL LOW (ref 13.0–17.0)

## 2023-03-22 LAB — CBG MONITORING, ED: Glucose-Capillary: 129 mg/dL — ABNORMAL HIGH (ref 70–99)

## 2023-03-22 LAB — PREPARE RBC (CROSSMATCH)

## 2023-03-22 LAB — MRSA NEXT GEN BY PCR, NASAL: MRSA by PCR Next Gen: NOT DETECTED

## 2023-03-22 LAB — BRAIN NATRIURETIC PEPTIDE: B Natriuretic Peptide: 572.4 pg/mL — ABNORMAL HIGH (ref 0.0–100.0)

## 2023-03-22 MED ORDER — VITAMIN B-12 1000 MCG PO TABS
1000.0000 ug | ORAL_TABLET | Freq: Every day | ORAL | Status: DC
  Administered 2023-03-23 – 2023-03-25 (×3): 1000 ug
  Filled 2023-03-22 (×3): qty 1

## 2023-03-22 MED ORDER — FENTANYL 2500MCG IN NS 250ML (10MCG/ML) PREMIX INFUSION
0.0000 ug/h | INTRAVENOUS | Status: DC
  Administered 2023-03-22: 25 ug/h via INTRAVENOUS
  Administered 2023-03-23 – 2023-03-24 (×2): 100 ug/h via INTRAVENOUS
  Filled 2023-03-22 (×3): qty 250

## 2023-03-22 MED ORDER — IOHEXOL 350 MG/ML SOLN
100.0000 mL | Freq: Once | INTRAVENOUS | Status: AC | PRN
  Administered 2023-03-22: 100 mL via INTRAVENOUS

## 2023-03-22 MED ORDER — SODIUM CHLORIDE 0.9% IV SOLUTION
Freq: Once | INTRAVENOUS | Status: DC
  Filled 2023-03-22: qty 250

## 2023-03-22 MED ORDER — PROPOFOL 1000 MG/100ML IV EMUL: 0.0000 ug/kg/min | INTRAVENOUS | Status: DC

## 2023-03-22 MED ORDER — FOLIC ACID 1 MG PO TABS: 1.0000 mg | ORAL_TABLET | Freq: Every day | ORAL | Status: DC

## 2023-03-22 MED ORDER — ACETAMINOPHEN 325 MG PO TABS: 650.0000 mg | ORAL_TABLET | ORAL | Status: DC | PRN

## 2023-03-22 MED ORDER — CHLORHEXIDINE GLUCONATE CLOTH 2 % EX PADS
6.0000 | MEDICATED_PAD | Freq: Every day | CUTANEOUS | Status: DC
  Administered 2023-03-22 – 2023-03-25 (×4): 6 via TOPICAL

## 2023-03-22 MED ORDER — SODIUM CHLORIDE 0.9 % IV SOLN
100.0000 mg | Freq: Two times a day (BID) | INTRAVENOUS | Status: DC
  Administered 2023-03-22: 100 mg via INTRAVENOUS
  Filled 2023-03-22 (×2): qty 100

## 2023-03-22 MED ORDER — ORAL CARE MOUTH RINSE
15.0000 mL | OROMUCOSAL | Status: DC
  Administered 2023-03-22 – 2023-03-25 (×40): 15 mL via OROMUCOSAL

## 2023-03-22 MED ORDER — METOPROLOL TARTRATE 25 MG PO TABS
25.0000 mg | ORAL_TABLET | Freq: Two times a day (BID) | ORAL | Status: DC
  Administered 2023-03-22: 25 mg via ORAL
  Filled 2023-03-22 (×2): qty 1

## 2023-03-22 MED ORDER — ASPIRIN 81 MG PO CHEW
81.0000 mg | CHEWABLE_TABLET | Freq: Every day | ORAL | Status: DC
  Filled 2023-03-22: qty 1

## 2023-03-22 MED ORDER — PROPOFOL 1000 MG/100ML IV EMUL
5.0000 ug/kg/min | INTRAVENOUS | Status: DC
  Administered 2023-03-22: 5 ug/kg/min via INTRAVENOUS
  Administered 2023-03-22 (×2): 55 ug/kg/min via INTRAVENOUS
  Administered 2023-03-22: 65 ug/kg/min via INTRAVENOUS
  Administered 2023-03-22: 55 ug/kg/min via INTRAVENOUS
  Administered 2023-03-23: 75 ug/kg/min via INTRAVENOUS
  Administered 2023-03-23: 35 ug/kg/min via INTRAVENOUS
  Administered 2023-03-23: 75 ug/kg/min via INTRAVENOUS
  Administered 2023-03-23: 80 ug/kg/min via INTRAVENOUS
  Administered 2023-03-23: 55 ug/kg/min via INTRAVENOUS
  Administered 2023-03-23 (×3): 75 ug/kg/min via INTRAVENOUS
  Administered 2023-03-23: 80 ug/kg/min via INTRAVENOUS
  Administered 2023-03-23: 65 ug/kg/min via INTRAVENOUS
  Administered 2023-03-24: 80 ug/kg/min via INTRAVENOUS
  Administered 2023-03-24: 10 ug/kg/min via INTRAVENOUS
  Administered 2023-03-24 (×2): 80 ug/kg/min via INTRAVENOUS
  Filled 2023-03-22 (×20): qty 100

## 2023-03-22 MED ORDER — THIAMINE MONONITRATE 100 MG PO TABS
100.0000 mg | ORAL_TABLET | Freq: Every day | ORAL | Status: DC
  Filled 2023-03-22: qty 1

## 2023-03-22 MED ORDER — THIAMINE MONONITRATE 100 MG PO TABS: 100.0000 mg | ORAL_TABLET | Freq: Every day | ORAL | Status: DC

## 2023-03-22 MED ORDER — THIAMINE HCL 100 MG/ML IJ SOLN: 100.0000 mg | Freq: Every day | INTRAMUSCULAR | Status: DC

## 2023-03-22 MED ORDER — ATORVASTATIN CALCIUM 80 MG PO TABS
80.0000 mg | ORAL_TABLET | Freq: Every day | ORAL | Status: DC
  Filled 2023-03-22: qty 1

## 2023-03-22 MED ORDER — FENTANYL CITRATE PF 50 MCG/ML IJ SOSY
150.0000 ug | PREFILLED_SYRINGE | Freq: Once | INTRAMUSCULAR | Status: AC
  Administered 2023-03-22: 150 ug via INTRAVENOUS
  Filled 2023-03-22: qty 3

## 2023-03-22 MED ORDER — BUDESONIDE 0.25 MG/2ML IN SUSP
0.2500 mg | Freq: Two times a day (BID) | RESPIRATORY_TRACT | Status: DC
  Administered 2023-03-22 – 2023-03-23 (×2): 0.25 mg via RESPIRATORY_TRACT
  Filled 2023-03-22 (×2): qty 2

## 2023-03-22 MED ORDER — ASPIRIN 81 MG PO CHEW
81.0000 mg | CHEWABLE_TABLET | Freq: Every day | ORAL | Status: DC
  Administered 2023-03-22: 81 mg
  Filled 2023-03-22: qty 1

## 2023-03-22 MED ORDER — ORAL CARE MOUTH RINSE: 15.0000 mL | OROMUCOSAL | Status: DC | PRN

## 2023-03-22 MED ORDER — FOLIC ACID 1 MG PO TABS
1.0000 mg | ORAL_TABLET | Freq: Every day | ORAL | Status: DC
  Filled 2023-03-22: qty 1

## 2023-03-22 MED ORDER — ROCURONIUM BROMIDE 10 MG/ML (PF) SYRINGE
50.0000 mg | PREFILLED_SYRINGE | Freq: Once | INTRAVENOUS | Status: AC
  Administered 2023-03-22: 50 mg via INTRAVENOUS
  Filled 2023-03-22: qty 10

## 2023-03-22 MED ORDER — FUROSEMIDE 10 MG/ML IJ SOLN: 60.0000 mg | Freq: Once | INTRAMUSCULAR | Status: DC

## 2023-03-22 MED ORDER — METHYLPREDNISOLONE SODIUM SUCC 40 MG IJ SOLR
40.0000 mg | Freq: Two times a day (BID) | INTRAMUSCULAR | Status: DC
  Administered 2023-03-22 (×2): 40 mg via INTRAVENOUS
  Filled 2023-03-22 (×2): qty 1

## 2023-03-22 MED ORDER — IPRATROPIUM-ALBUTEROL 0.5-2.5 (3) MG/3ML IN SOLN
3.0000 mL | RESPIRATORY_TRACT | Status: DC
  Administered 2023-03-22 – 2023-03-23 (×8): 3 mL via RESPIRATORY_TRACT
  Filled 2023-03-22 (×8): qty 3

## 2023-03-22 MED ORDER — MORPHINE SULFATE (PF) 4 MG/ML IV SOLN: 4.0000 mg | INTRAVENOUS | Status: DC | PRN

## 2023-03-22 MED ORDER — ATORVASTATIN CALCIUM 80 MG PO TABS: 80.0000 mg | ORAL_TABLET | Freq: Every day | ORAL | Status: DC

## 2023-03-22 MED ORDER — METOPROLOL TARTRATE 25 MG PO TABS: 25.0000 mg | ORAL_TABLET | Freq: Two times a day (BID) | ORAL | Status: DC

## 2023-03-22 NOTE — ED Notes (Signed)
 Pt is in bed, resting with eyes closed. Pt is much calmer. PRBC's are infusing. No acute distress observed.

## 2023-03-22 NOTE — Progress Notes (Addendum)
 Progress Note    Marc Fischer  UJW:[id number] DOB: 06-Aug-1950  DOA: 03/19/2023 PCP: Pcp, No      Brief Narrative:    Medical records reviewed and are as summarized below:  Marc Fischer is a 73 y.o. male with medical history significant of hypertension, hyperlipidemia, stroke, GERD, tobacco use disorder, alcohol use disorder, cocaine use disorder, obesity, who presented to the hospital with left-sided weakness, left facial droop, slurred speech, fall, abdominal pain and abdominal distention.     Assessment/Plan:   Principal Problem:   Unresponsiveness Active Problems:   Stroke (HCC)   HTN (hypertension)   HLD (hyperlipidemia)   Fall at home, initial encounter   Hypokalemia   AKI (acute kidney injury) (HCC)   Metabolic acidosis   Abdominal distension   Aspiration pneumonia (HCC)   Tobacco abuse   Alcohol abuse   Obesity (BMI 30-39.9)   Splenic hemorrhage_subscapular splenic hematoma    Body mass index is 30.42 kg/m.  (Obesity)  70% left and 60% right   Acute Stroke [facility]):  Bilateral proximal internal carotid artery stenosis (left 70% and right 60%) Patient has a history of stroke. MRI showed acute perforator infarct in the right basal ganglia.  CTA negative for LVO. Avoid ASA due to subscapular splenic hematoma. LDL 148, A1c 5.2. Start Lipitor 80 mg daily when patient is able to safely take food or medicines by mouth 2D echo showed EF estimated 55 to 60%, indeterminate LV diastolic parameters, moderately dilated left atrium, trivial MR, borderline dilatation of the ascending aorta measuring 39 mm. SLP/ PT/ OT eval. No plan for surgical intervention of carotid artery stenosis at this time per vascular surgeon.  Patient will be seen as an outpatient to determine timing of surgery. Keppra discontinued   Acute hypoxemic respiratory failure He was placed on nonrebreathing mask for oxygenation. Consulted Dr. Karna Christmas, intensivist, because of worsening  respiratory failure and respiratory distress.  Case was discussed.  Patient was eventually intubated and placed on mechanical ventilation. Dr. Karna Christmas, has graciously taken over the care of this patient. Case was also discussed with Dr. Lenard Lance, ED physician, at the bedside    Bilateral pneumonia, aspiration pneumonia Continue empiric IV antibiotics   Acute NSTEMI Troponins up to 12 - 7,174 - 8,417- 9,245. Follow-up with cardiologist.   Splenic hemorrhage_subscapular splenic hematoma:  Acute blood loss anemia Hemoglobin trended downward to 7.0.  Hemoglobin was 14.2 on admission.  He was transfused 1 unit of packed red blood cells.   Posttransfusion hemoglobin up to 8.1.  Monitor H&H closely and transfuse as needed. Repeat CT abdomen and pelvis showed progressing subcapsular hematoma in the liver, organizing splenic subcapsular hematoma similar to prior study, hemoperitoneum and multiple acute left rib fractures. Dr. Claudine Mouton, general surgeon, was notified by nocturnist, Steward Drone, NP, of these findings. Follow-up with general surgeon.    Alcohol withdrawal syndrome: Continue IV phenobarbital and Ativan as needed per CIWA protocol Aspiration and seizure precautions    Hypokalemia and hypomagnesemia:  Improved.  Monitor and replete as needed    AKI (acute kidney injury) (HCC) Metabolic acidosis:  Right is trending down.  1.92-1.40-1.25. Continue IV fluids for hydration. Avoid nephrotoxic drugs.     Comorbidities including hypertension, hyperlipidemia, polysubstance use disorder (cocaine, tobacco and alcohol)      CRITICAL CARE Performed by: Lurene Shadow   Total critical care time: 35 minutes  Critical care time was exclusive of separately billable procedures and treating other patients.  Critical care was necessary to treat  or prevent imminent or life-threatening deterioration.  Critical care was time spent personally by me on the following activities:  development of treatment plan with patient and/or surrogate as well as nursing, discussions with consultants, evaluation of patient's response to treatment, examination of patient, obtaining history from patient or surrogate, ordering and performing treatments and interventions, ordering and review of laboratory studies, ordering and review of radiographic studies, pulse oximetry and re-evaluation of patient's condition.      Diet Order     None                Consultants: General surgeon Neurologist  Procedures: None    Medications:     stroke: early stages of recovery book   Does not apply Once   sodium chloride   Intravenous Once   amLODipine  5 mg Oral Daily   atorvastatin  80 mg Oral Daily   budesonide (PULMICORT) nebulizer solution  0.25 mg Nebulization BID   vitamin B-12  1,000 mcg Oral Daily   folic acid  1 mg Oral Daily   furosemide  60 mg Intravenous Once   ipratropium-albuterol  3 mL Nebulization Q4H   lidocaine  1 patch Transdermal Q24H   methylPREDNISolone (SOLU-MEDROL) injection  40 mg Intravenous Q12H   metoprolol tartrate  25 mg Oral BID   multivitamin with minerals  1 tablet Oral Daily   nicotine  21 mg Transdermal Daily   PHENObarbital  97.5 mg Intravenous Q8H   Followed by   Melene Muller ON 03/24/2023] PHENObarbital  65 mg Intravenous Q8H   Followed by   Melene Muller ON 03/21/2023] PHENObarbital  32.5 mg Intravenous Q8H   thiamine  100 mg Oral Daily   Or   thiamine  100 mg Intravenous Daily   Continuous Infusions:  ampicillin-sulbactam (UNASYN) IV Stopped (03/22/23 0410)   doxycycline (VIBRAMYCIN) IV 100 mg (03/22/23 0617)     Anti-infectives (From admission, onward)    Start     Dose/Rate Route Frequency Ordered Stop   03/22/23 0530  doxycycline (VIBRAMYCIN) 100 mg in sodium chloride 0.9 % 250 mL IVPB        100 mg 125 mL/hr over 120 Minutes Intravenous Every 12 hours 03/22/23 0526     03/19/23 2200  Ampicillin-Sulbactam (UNASYN) 3 g in sodium  chloride 0.9 % 100 mL IVPB        3 g 200 mL/hr over 30 Minutes Intravenous Every 6 hours 03/19/23 2115                Family Communication/Anticipated D/C date and plan/Code Status   DVT prophylaxis: Place and maintain sequential compression device Start: 03/19/23 2218     Code Status: Full Code  Family Communication: None Disposition Plan: Plan to discharge home   Status is: Inpatient Remains inpatient appropriate because: Acute stroke, AKI       Subjective:   Interval events noted.  Patient has become more confused and had been placed in soft wrist restraints.  Tresa Endo, RN also reported outpatient had oxygen desaturation and patient was placed on oxygen via nonrebreather mask.  Objective:    Vitals:   03/22/23 0700 03/22/23 0713 03/22/23 0715 03/22/23 0725  BP: (!) 127/98 113/63 113/63   Pulse: (!) 105 92 98   Resp:  (!) 26 (!) 27   Temp:  98.1 F (36.7 C)    TempSrc:      SpO2: 96% (!) 87% (!) 85% 96%  Weight:      Height:  No data found.   Intake/Output Summary (Last 24 hours) at 03/22/2023 0746 Last data filed at 03/22/2023 0715 Gross per 24 hour  Intake 726.94 ml  Output 450 ml  Net 276.94 ml   Filed Weights   03/19/23 1650  Weight: 96.2 kg    Exam:   GEN: Mild respiratory distress, soft wrist restraints in place SKIN: Warm and dry EYES: No pallor or icterus ENT: MMM CV: RRR PULM: Bilateral rales, bilateral rhonchi ABD: soft, ND, NT, +BS CNS: Lethargic, mumbles a few incomprehensible words, moves all extremities spontaneously EXT: No edema or tenderness   Data Reviewed:   I have personally reviewed following labs and imaging studies:  Labs: Labs show the following:   Basic Metabolic Panel: Recent Labs  Lab 03/19/23 1659 03/19/23 1701 03/20/23 0514 03/21/23 0556 03/21/23 2103 03/22/23 0527  NA 133*  --  134* 137 137 140  K 3.1*  --  3.5 3.4* 3.3* 4.1  CL 99  --  101 105 106 111  CO2 19*  --  24 20* 21* 20*   GLUCOSE 125*  --  103* 127* 123* 130*  BUN 14  --  20 31* 33* 28*  CREATININE 1.41*  --  1.61* 1.92* 1.40* 1.25*  CALCIUM 8.3*  --  8.1* 8.1* 8.3* 8.4*  MG  --  1.4* 2.6*  --  2.1 2.1  PHOS  --  3.6  --   --   --  1.9*   GFR Estimated Creatinine Clearance: 62.2 mL/min (A) (by C-G formula based on SCr of 1.25 mg/dL (H)). Liver Function Tests: Recent Labs  Lab 03/19/23 1659 03/21/23 2103  AST 30 56*  ALT 19 15  ALKPHOS 62 53  BILITOT 0.6 0.7  PROT 6.5 5.9*  ALBUMIN 3.5 3.2*   Recent Labs  Lab 03/19/23 1659  LIPASE 32   Recent Labs  Lab 03/19/23 1820 03/21/23 2104  AMMONIA 20 31   Coagulation profile Recent Labs  Lab 03/19/23 1720 03/22/23 0052  INR 1.1 1.1    CBC: Recent Labs  Lab 03/19/23 1659 03/19/23 2322 03/20/23 1720 03/20/23 2333 03/21/23 0556 03/21/23 1740 03/22/23 0052 03/22/23 0527  WBC 13.0*   < > 13.9* 15.7* 14.1*  --  11.7* 11.4*  NEUTROABS 10.0*  --   --   --   --   --   --  10.3*  HGB 14.2   < > 9.2* 9.4* 8.5* 7.8* 7.0* 7.6*  HCT 40.6   < > 26.4* 26.9* 24.7* 23.1* 20.3* 22.8*  MCV 95.1   < > 97.4 96.1 97.6  --  97.6 100.0  PLT 225   < > 201 209 187  --  166 171   < > = values in this interval not displayed.   Cardiac Enzymes: No results for input(s): "CKTOTAL", "CKMB", "CKMBINDEX", "TROPONINI" in the last 168 hours. BNP (last 3 results) No results for input(s): "PROBNP" in the last 8760 hours. CBG: Recent Labs  Lab 03/22/23 0612  GLUCAP 129*   D-Dimer: No results for input(s): "DDIMER" in the last 72 hours. Hgb A1c: Recent Labs    03/20/23 0514  HGBA1C 5.2   Lipid Profile: Recent Labs    03/20/23 0514  CHOL 203*  HDL 30*  LDLCALC 148*  TRIG 124  CHOLHDL 6.8   Thyroid function studies: No results for input(s): "TSH", "T4TOTAL", "T3FREE", "THYROIDAB" in the last 72 hours.  Invalid input(s): "FREET3" Anemia work up: Recent Labs    03/21/23 1740  VITAMINB12 200   Sepsis Labs: Recent Labs  Lab 03/20/23 2333  03/21/23 0556 03/22/23 0052 03/22/23 0527  WBC 15.7* 14.1* 11.7* 11.4*    Microbiology Recent Results (from the past 240 hours)  Culture, blood (Routine X 2) w Reflex to ID Panel     Status: None (Preliminary result)   Collection Time: 03/19/23 11:38 PM   Specimen: BLOOD  Result Value Ref Range Status   Specimen Description BLOOD BLOOD RIGHT ARM  Final   Special Requests   Final    BOTTLES DRAWN AEROBIC AND ANAEROBIC Blood Culture adequate volume   Culture   Final    NO GROWTH 2 DAYS Performed at [facility], [address] [place], [place] [address]    Report Status PENDING  Incomplete  Culture, blood (Routine X 2) w Reflex to ID Panel     Status: None (Preliminary result)   Collection Time: 03/19/23 11:38 PM   Specimen: BLOOD  Result Value Ref Range Status   Specimen Description BLOOD RIGHT ARM  Final   Special Requests   Final    BOTTLES DRAWN AEROBIC AND ANAEROBIC Blood Culture results may not be optimal due to an inadequate volume of blood received in culture bottles   Culture   Final    NO GROWTH 2 DAYS Performed at [facility], [address], [place], [place] [address]    Report Status PENDING  Incomplete    Procedures and diagnostic studies:  CT CHEST ABDOMEN PELVIS WO CONTRAST Result Date: 03/21/2023 CLINICAL DATA:  Blunt abdominal trauma. Follow-up splenic hematoma. Drop in hemoglobin. Left-sided weakness. EXAM: CT CHEST, ABDOMEN AND PELVIS WITHOUT CONTRAST TECHNIQUE: Multidetector CT imaging of the chest, abdomen and pelvis was performed following the standard protocol without IV contrast. RADIATION DOSE REDUCTION: This exam was performed according to the departmental dose-optimization program which includes automated exposure control, adjustment of the mA and/or kV according to patient size and/or use of iterative reconstruction technique. COMPARISON:  Chest radiograph 03/19/2023. CT abdomen and pelvis 03/19/2023. FINDINGS: CT CHEST FINDINGS  Cardiovascular: Mild cardiac enlargement. No pericardial effusions. Normal caliber thoracic aorta. Calcification of the aorta and coronary arteries. Mediastinum/Nodes: Esophagus is decompressed. No significant lymphadenopathy. Thyroid gland is unremarkable. Lungs/Pleura: Motion artifact limits examination. There is atelectasis or consolidation in both lower lungs. No visible pleural effusion or pneumothorax. Musculoskeletal: Acute appearing fractures of the anterolateral left third through tenth ribs. Sternum and spine appear intact. CT ABDOMEN PELVIS FINDINGS Hepatobiliary: Subcapsular hematoma along the right lobe of the liver measuring 1.7 cm depth. This is progressing since the previous study. Increased density in the gallbladder likely representing vicarious excretion of contrast material. No bile duct dilatation. Pancreas: Unremarkable. No pancreatic ductal dilatation or surrounding inflammatory changes. Spleen: Organizing splenic hematoma measuring 4.2 cm depth, similar to prior study. Adrenals/Urinary Tract: Adrenal glands are unremarkable. Kidneys are normal, without renal calculi, focal lesion, or hydronephrosis. Bladder is unremarkable. Stomach/Bowel: Stomach, small bowel, and colon are not abnormally distended. No wall thickening or inflammatory changes. Vascular/Lymphatic: Aortic atherosclerosis. No enlarged abdominal or pelvic lymph nodes. Reproductive: Prostate is unremarkable. Other: Free fluid in the abdomen and pelvis with increased density consistent with hemorrhagic ascites. This appears mildly increased since the prior study. No free intra-abdominal air. Musculoskeletal: Degenerative changes in the spine. No acute bony abnormalities. IMPRESSION: 1. Multiple acute left rib fractures. 2. Atelectasis or consolidation in both lower lungs, progressing since prior study. 3. Progressing subcapsular hematoma in the liver. 4. Organizing splenic subcapsular hematoma similar to  prior study. 5. Increased  density in the gallbladder consistent with vicarious excretion of contrast material. 6. Hemoperitoneum, mildly increased since prior study. 7. Aortic atherosclerosis. These results were called by telephone at the time of interpretation on 03/21/2023 at 11:18 pm to provider [facility] , who verbally acknowledged these results. Electronically Signed   By: Burman Nieves M.D.   On: 03/21/2023 23:24   CT HEAD WO CONTRAST ( ) Result Date: 03/21/2023 CLINICAL DATA:  Follow-up stroke.  Left-sided weakness. EXAM: CT HEAD WITHOUT CONTRAST TECHNIQUE: Contiguous axial images were obtained from the base of the skull through the vertex without intravenous contrast. RADIATION DOSE REDUCTION: This exam was performed according to the departmental dose-optimization program which includes automated exposure control, adjustment of the mA and/or kV according to patient size and/or use of iterative reconstruction technique. COMPARISON:  MRI brain 03/19/2023.  CT head 03/19/2023 FINDINGS: Brain: Diffuse cerebral atrophy. Mild ventricular dilatation consistent with central atrophy. Low-attenuation changes in the deep white matter consistent with small vessel ischemia. Focal area of low-attenuation in the right thalamus and basal ganglia corresponding to acute infarct seen on prior MRI. Similar appearance to previous study. No developing mass effect or intracranial hemorrhage. Vascular: No hyperdense vessel or unexpected calcification. Skull: Normal. Negative for fracture or focal lesion. Sinuses/Orbits: No acute finding. Other: None. IMPRESSION: Focal low-attenuation in the right basal ganglia and thalamus corresponding to known acute infarct. No significant change since prior study. No developing mass effect or intracranial hemorrhage. Chronic atrophy and small vessel ischemic changes. Electronically Signed   By: Burman Nieves M.D.   On: 03/21/2023 23:10   ECHOCARDIOGRAM COMPLETE Result Date: 03/21/2023    ECHOCARDIOGRAM  REPORT   Patient Name:   ARJAY JASKIEWICZ Date of Exam: 03/21/2023 Medical Rec #:  [id number]      Height:       70.0 in Accession #:    [id number]     Weight:       212.0 lb Date of Birth:  1950/05/30     BSA:          2.140 m Patient Age:    72 years       BP:           166/88 mmHg Patient Gender: M              HR:           93 bpm. Exam Location:  [facility] Procedure: 2D Echo Indications:     Stroke I63.9  History:         Patient has no prior history of Echocardiogram examinations.  Sonographer:     Elwin Sleight RDCS Referring Phys:  [id number] Lorretta Harp Diagnosing Phys: Jodelle Red MD  Sonographer Comments: Technically difficult study due to poor echo windows and suboptimal subcostal window. Image acquisition challenging due to patient behavioral factors., Image acquisition challenging due to uncooperative patient and Image acquisition  challenging due to respiratory motion. IMPRESSIONS  1. Left ventricular ejection fraction, by estimation, is 55 to 60%. The left ventricle has normal function. The left ventricle has no regional wall motion abnormalities. Left ventricular diastolic parameters are indeterminate.  2. Right ventricular systolic function is normal. The right ventricular size is normal.  3. Left atrial size was mildly dilated.  4. The mitral valve is normal in structure. Trivial mitral valve regurgitation. No evidence of mitral stenosis.  5. The aortic valve has an indeterminant number of cusps. There is mild calcification of the aortic valve. There is  mild thickening of the aortic valve. Aortic valve regurgitation is not visualized. Aortic valve sclerosis/calcification is present, without any evidence of aortic stenosis.  6. Aortic dilatation noted. There is borderline dilatation of the ascending aorta, measuring 39 mm.  7. The inferior vena cava is normal in size with greater than 50% respiratory variability, suggesting right atrial pressure of 3 mmHg. Comparison(s): No prior Echocardiogram.  Conclusion(s)/Recommendation(s): Normal biventricular function without evidence of hemodynamically significant valvular heart disease. No intracardiac source of embolism detected on this transthoracic study. Consider a transesophageal echocardiogram to exclude cardiac source of embolism if clinically indicated. FINDINGS  Left Ventricle: Left ventricular ejection fraction, by estimation, is 55 to 60%. The left ventricle has normal function. The left ventricle has no regional wall motion abnormalities. The left ventricular internal cavity size was normal in size. There is  no left ventricular hypertrophy. Left ventricular diastolic parameters are indeterminate. Right Ventricle: The right ventricular size is normal. No increase in right ventricular wall thickness. Right ventricular systolic function is normal. Left Atrium: Left atrial size was mildly dilated. Right Atrium: Right atrial size was normal in size. Pericardium: There is no evidence of pericardial effusion. Mitral Valve: The mitral valve is normal in structure. Trivial mitral valve regurgitation. No evidence of mitral valve stenosis. Tricuspid Valve: The tricuspid valve is normal in structure. Tricuspid valve regurgitation is trivial. No evidence of tricuspid stenosis. Aortic Valve: The aortic valve has an indeterminant number of cusps. There is mild calcification of the aortic valve. There is mild thickening of the aortic valve. Aortic valve regurgitation is not visualized. Aortic valve sclerosis/calcification is present, without any evidence of aortic stenosis. Aortic valve peak gradient measures 9.6 mmHg. Pulmonic Valve: The pulmonic valve was not well visualized. Pulmonic valve regurgitation is not visualized. No evidence of pulmonic stenosis. Aorta: Aortic dilatation noted. There is borderline dilatation of the ascending aorta, measuring 39 mm. Venous: The inferior vena cava is normal in size with greater than 50% respiratory variability, suggesting  right atrial pressure of 3 mmHg. IAS/Shunts: The atrial septum is grossly normal.  LEFT VENTRICLE PLAX 2D LVIDd:         4.90 cm Diastology LVIDs:         3.50 cm LV e' medial:    7.96 cm/s LV PW:         1.00 cm LV E/e' medial:  9.9 LV IVS:        1.10 cm LV e' lateral:   6.47 cm/s                        LV E/e' lateral: 12.2  RIGHT VENTRICLE RV Basal diam:  2.90 cm RV S prime:     17.90 cm/s TAPSE (M-mode): 2.3 cm LEFT ATRIUM           Index        RIGHT ATRIUM           Index LA diam:      4.10 cm 1.92 cm/m   RA Area:     14.20 cm LA Vol (A2C): 85.7 ml 40.05 ml/m  RA Volume:   36.10 ml  16.87 ml/m LA Vol (A4C): 37.2 ml 17.39 ml/m  AORTIC VALVE              PULMONIC VALVE AV Vmax:      155.00 cm/s PV Vmax:        1.38 m/s AV Peak Grad: 9.6 mmHg    PV Peak  grad:   7.6 mmHg LVOT Vmax:    114.00 cm/s RVOT Peak grad: 3 mmHg LVOT Vmean:   76.900 cm/s LVOT VTI:     0.188 m  AORTA Ao Asc diam: 3.90 cm MITRAL VALVE MV Area (PHT): 4.41 cm    SHUNTS MV Decel Time: 172 msec    Systemic VTI: 0.19 m MV E velocity: 78.90 cm/s MV A velocity: 92.20 cm/s MV E/A ratio:  0.86 Jodelle Red MD Electronically signed by Jodelle Red MD Signature Date/Time: 03/21/2023/1:44:46 PM    Final                LOS: 3 days   Lurene Shadow  [facility]   Pager on [email or web address]. If 7PM-7AM, please contact night-coverage at [email or web address]     03/22/2023, 7:46 AM

## 2023-03-22 NOTE — Progress Notes (Signed)
 Patient transported from ICU to CT. Patient transported from CT back to ICU. No issues with transport.

## 2023-03-22 NOTE — Progress Notes (Signed)
 PT Cancellation Note  Patient Details Name: Marc Fischer MRN: [id number] DOB: 19-Mar-1950   Cancelled Treatment:    Reason Eval/Treat Not Completed: Patient not medically ready. Chart reviewed, pt appears to have had a change in respiratory status, PT to hold until pt is medically appropriate.   Olga Coaster PT, DPT 12:39 PM,03/22/23

## 2023-03-22 NOTE — ED Notes (Signed)
 Trop 2130 - provider notified.
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
## 2023-03-22 NOTE — Progress Notes (Signed)
 Per Dr. Karna Christmas, OGT safe for use.

## 2023-03-22 NOTE — ED Notes (Signed)
 RN at bedside for bedside report. Pt restless and pulling at monitoring cords and IV. Pt given PRN medication by nightshift. Mitts placed on pt by this RN for safety of pt and allow continuous monitoring. Pt with labored breathing and accessory muscle use. Pt currently on 2L with oxygen sats decreasing to 86%.  increased to 5L

## 2023-03-22 NOTE — ED Provider Notes (Signed)
-----------------------------------------   7:44 AM on 03/22/2023 ----------------------------------------- I personally seen and evaluated the patient this morning.  Per nurse patient had desatted into the 80s on nasal cannula oxygen.  Placed on nonrebreather.  Patient has been receiving phenobarbital as well as Ativan for sedation as well as agitation as he is in what appears to be in pretty florid DTs.  Patient will open eyes will look at you at times when you talk to him.  However he is not following any commands is not answering any questions.  Saw the repeat EKG I did not see any sign of a STEMI.  However given the patient's decompensation with what appears to be significant DTs now respiratory compromise the hospitalist has been called as the patient has been admitted since Friday.  He is also now at the bedside.  He will discussed with the ICU.  We will repeat a chest x-ray to rule out any pneumothorax or significant change.  We will continue to closely monitor in the emergency department, while awaiting ICU availability.  Hospitalist continues to primarily manage.  ICU physician has seen the patient opted to intubate and the patient will be admitted to the intensive care unit.   Minna Antis, MD 03/22/23 1420
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
## 2023-03-22 NOTE — Progress Notes (Signed)
 SLP Cancellation Note  Patient Details Name: Marc Fischer MRN: [id number] DOB: August 27, 1950   Cancelled treatment:       Reason Eval/Treat Not Completed: Medical issues which prohibited therapy  Pt currently intubated.  [name] 03/22/2023, 3:11 PM

## 2023-03-22 NOTE — ED Notes (Signed)
 Patient asleep.  Lab results reviewed and went over by provider.  Patient status communicated to provider.  The patient is asleep and appears to be comfortable after receiving pain medication. Lidocaine patch in place on left abd. CIWA 0 - while patient asleep.   Patient breathing is slightly labored. Lung sounds - course crackles. Breathing treatment given to the patient using mask.  Abdomen distended, review imaging.   Fall risk precautions in place, bed alarm on - room near nurses station.

## 2023-03-22 NOTE — ED Notes (Signed)
 Pt more lethargic and harder to arouse. Pt oxygen 68% on 5L [place]. Pt continues to have labored breathing and accessory muscle use. Pt placed on NRB. RT called

## 2023-03-22 NOTE — ED Notes (Signed)
 This narrator walked into the pt's room after hearing him grunt, Pt was pulling all his monitoring devices on him. Made several attempts to reorient pt and calm pt but pt kept taking off his monitoring devices after I reapplied them. Pt was given ordered dose of Phenobarbital IV for agitation.

## 2023-03-22 NOTE — Procedures (Addendum)
 Endotracheal Intubation: Patient required placement of an artificial airway secondary to Respiratory Failure  Consent: Emergent.   Hand washing performed prior to starting the procedure.   Medications administered for sedation prior to procedure:   rocuronium 40 mg IV, Fentanyl 150 mcg IV.    A time out procedure was called and correct patient, name, & ID confirmed. Needed supplies and equipment were assembled and checked to include ETT, 10 ml syringe, Glidescope, Mac and Miller blades, suction, oxygen and bag mask valve, end tidal CO2 monitor.   Patient was positioned to align the mouth and pharynx to facilitate visualization of the glottis.   Heart rate, SpO2 and blood pressure was continuously monitored during the procedure. Pre-oxygenation was conducted prior to intubation and endotracheal tube was placed through the vocal cords into the trachea.     The artificial airway was placed under direct visualization via glidescope route using a 8 ETT on the first attempt.  ETT was secured at 24 cm mark.  Placement was confirmed by auscuitation of lungs with good breath sounds bilaterally and no stomach sounds.  Condensation was noted on endotracheal tube.   Pulse ox 98%.  CO2 detector in place with appropriate color change.   Complications: None .    Chest radiograph ordered and pending.   Comments: OGT placed via glidescope.    Vida Rigger, M.D.  Pulmonary & Critical Care Medicine  [facility]

## 2023-03-22 NOTE — ED Notes (Signed)
 RN at bedside - pt pulling at chest. RN asking pt if he is having CP and pt nodding yes. Repeat EKG obtained

## 2023-03-22 NOTE — ED Notes (Signed)
 RN on phone with admission MD to come to bedside

## 2023-03-22 NOTE — Progress Notes (Signed)
 Tube retracted 3cm per Dr. Karna Christmas order. From 25 cm to 22 cm at teeth.

## 2023-03-22 NOTE — ED Notes (Signed)
 RT at bedside. Hospitalist paged

## 2023-03-22 NOTE — ED Notes (Signed)
 RT, 2 Rns, RT and ICU MD at bedside for intubation

## 2023-03-22 NOTE — Plan of Care (Signed)
  Problem: Ischemic Stroke/TIA Tissue Perfusion: Goal: Complications of ischemic stroke/TIA will be minimized Outcome: Progressing   Problem: Clinical Measurements: Goal: Will remain free from infection Outcome: Progressing Goal: Cardiovascular complication will be avoided Outcome: Progressing   Problem: Coping: Goal: Level of anxiety will decrease Outcome: Progressing   Problem: Elimination: Goal: Will not experience complications related to urinary retention Outcome: Progressing   Problem: Pain Managment: Goal: General experience of comfort will improve and/or be controlled Outcome: Progressing   Problem: Skin Integrity: Goal: Risk for impaired skin integrity will decrease Outcome: Progressing

## 2023-03-22 NOTE — ED Notes (Addendum)
 ETT 25 teeth  Size 8

## 2023-03-23 ENCOUNTER — Inpatient Hospital Stay: Payer: Medicare HMO

## 2023-03-23 ENCOUNTER — Telehealth: Payer: Self-pay | Admitting: Pharmacy Technician

## 2023-03-23 DIAGNOSIS — J9601 Acute respiratory failure with hypoxia: Secondary | ICD-10-CM

## 2023-03-23 DIAGNOSIS — D735 Infarction of spleen: Secondary | ICD-10-CM | POA: Diagnosis not present

## 2023-03-23 DIAGNOSIS — N179 Acute kidney failure, unspecified: Secondary | ICD-10-CM

## 2023-03-23 DIAGNOSIS — R578 Other shock: Secondary | ICD-10-CM | POA: Diagnosis not present

## 2023-03-23 DIAGNOSIS — J690 Pneumonitis due to inhalation of food and vomit: Secondary | ICD-10-CM

## 2023-03-23 DIAGNOSIS — I639 Cerebral infarction, unspecified: Secondary | ICD-10-CM | POA: Diagnosis not present

## 2023-03-23 DIAGNOSIS — Z789 Other specified health status: Secondary | ICD-10-CM

## 2023-03-23 LAB — CBC
HCT: 22.7 % — ABNORMAL LOW (ref 39.0–52.0)
Hemoglobin: 7.6 g/dL — ABNORMAL LOW (ref 13.0–17.0)
MCH: 33.8 pg (ref 26.0–34.0)
MCHC: 33.5 g/dL (ref 30.0–36.0)
MCV: 100.9 fL — ABNORMAL HIGH (ref 80.0–100.0)
Platelets: 180 10*3/uL (ref 150–400)
RBC: 2.25 MIL/uL — ABNORMAL LOW (ref 4.22–5.81)
RDW: 14.2 % (ref 11.5–15.5)
WBC: 13.2 10*3/uL — ABNORMAL HIGH (ref 4.0–10.5)
nRBC: 0 % (ref 0.0–0.2)

## 2023-03-23 LAB — COMPREHENSIVE METABOLIC PANEL
ALT: 20 U/L (ref 0–44)
AST: 85 U/L — ABNORMAL HIGH (ref 15–41)
Albumin: 3.1 g/dL — ABNORMAL LOW (ref 3.5–5.0)
Alkaline Phosphatase: 48 U/L (ref 38–126)
Anion gap: 8 (ref 5–15)
BUN: 40 mg/dL — ABNORMAL HIGH (ref 8–23)
CO2: 23 mmol/L (ref 22–32)
Calcium: 8.1 mg/dL — ABNORMAL LOW (ref 8.9–10.3)
Chloride: 106 mmol/L (ref 98–111)
Creatinine, Ser: 1.82 mg/dL — ABNORMAL HIGH (ref 0.61–1.24)
GFR, Estimated: 39 mL/min — ABNORMAL LOW (ref >60)
Glucose, Bld: 125 mg/dL — ABNORMAL HIGH (ref 70–99)
Potassium: 4.5 mmol/L (ref 3.5–5.1)
Sodium: 137 mmol/L (ref 135–145)
Total Bilirubin: 1.5 mg/dL — ABNORMAL HIGH (ref 0.0–1.2)
Total Protein: 5.8 g/dL — ABNORMAL LOW (ref 6.5–8.1)

## 2023-03-23 LAB — BPAM RBC
Blood Product Expiration Date: 202502152359
ISSUE DATE / TIME: 202502020410
Unit Type and Rh: 5100

## 2023-03-23 LAB — GLUCOSE, CAPILLARY
Glucose-Capillary: 105 mg/dL — ABNORMAL HIGH (ref 70–99)
Glucose-Capillary: 111 mg/dL — ABNORMAL HIGH (ref 70–99)
Glucose-Capillary: 112 mg/dL — ABNORMAL HIGH (ref 70–99)
Glucose-Capillary: 112 mg/dL — ABNORMAL HIGH (ref 70–99)
Glucose-Capillary: 115 mg/dL — ABNORMAL HIGH (ref 70–99)
Glucose-Capillary: 117 mg/dL — ABNORMAL HIGH (ref 70–99)

## 2023-03-23 LAB — TYPE AND SCREEN
ABO/RH(D): O POS
Antibody Screen: NEGATIVE
Unit division: 0

## 2023-03-23 LAB — MYOGLOBIN, SERUM: Myoglobin: 482 ng/mL — ABNORMAL HIGH (ref 28–72)

## 2023-03-23 LAB — HEMOGLOBIN
Hemoglobin: 7.7 g/dL — ABNORMAL LOW (ref 13.0–17.0)
Hemoglobin: 7.2 g/dL — ABNORMAL LOW (ref 13.0–17.0)

## 2023-03-23 LAB — PHOSPHORUS: Phosphorus: 5.6 mg/dL — ABNORMAL HIGH (ref 2.5–4.6)

## 2023-03-23 LAB — CK: Total CK: 549 U/L — ABNORMAL HIGH (ref 49–397)

## 2023-03-23 LAB — MAGNESIUM: Magnesium: 2.7 mg/dL — ABNORMAL HIGH (ref 1.7–2.4)

## 2023-03-23 MED ORDER — ASPIRIN 81 MG PO CHEW
81.0000 mg | CHEWABLE_TABLET | Freq: Every day | ORAL | Status: DC
Start: 2023-03-23 — End: 2023-03-25
  Administered 2023-03-23 – 2023-03-25 (×3): 81 mg
  Filled 2023-03-23 (×2): qty 1

## 2023-03-23 MED ORDER — THIAMINE MONONITRATE 100 MG PO TABS
100.0000 mg | ORAL_TABLET | Freq: Every day | ORAL | Status: DC
  Administered 2023-03-23 – 2023-03-25 (×3): 100 mg
  Filled 2023-03-23 (×2): qty 1

## 2023-03-23 MED ORDER — ATORVASTATIN CALCIUM 80 MG PO TABS
80.0000 mg | ORAL_TABLET | Freq: Every day | ORAL | Status: DC
  Administered 2023-03-23 – 2023-03-25 (×3): 80 mg
  Filled 2023-03-23 (×2): qty 1

## 2023-03-23 MED ORDER — IPRATROPIUM-ALBUTEROL 0.5-2.5 (3) MG/3ML IN SOLN
3.0000 mL | Freq: Four times a day (QID) | RESPIRATORY_TRACT | Status: DC
  Administered 2023-03-23 – 2023-03-25 (×9): 3 mL via RESPIRATORY_TRACT
  Filled 2023-03-23 (×9): qty 3

## 2023-03-23 MED ORDER — METOPROLOL TARTRATE 25 MG PO TABS
25.0000 mg | ORAL_TABLET | Freq: Two times a day (BID) | ORAL | Status: DC
  Administered 2023-03-23 – 2023-03-25 (×4): 25 mg
  Filled 2023-03-23 (×3): qty 1

## 2023-03-23 MED ORDER — THIAMINE HCL 100 MG/ML IJ SOLN: 100.0000 mg | Freq: Every day | INTRAMUSCULAR | Status: DC

## 2023-03-23 MED ORDER — ACETAMINOPHEN 325 MG PO TABS: 650.0000 mg | ORAL_TABLET | ORAL | Status: DC | PRN

## 2023-03-23 MED ORDER — FREE WATER
100.0000 mL | Status: DC
  Administered 2023-03-23 – 2023-03-25 (×12): 100 mL

## 2023-03-23 MED ORDER — PROSOURCE TF20 ENFIT COMPATIBL EN LIQD
60.0000 mL | Freq: Two times a day (BID) | ENTERAL | Status: DC
  Administered 2023-03-24 – 2023-03-25 (×3): 60 mL
  Filled 2023-03-23: qty 60

## 2023-03-23 MED ORDER — DOCUSATE SODIUM 50 MG/5ML PO LIQD
100.0000 mg | Freq: Two times a day (BID) | ORAL | Status: DC
  Administered 2023-03-23 – 2023-03-24 (×4): 100 mg
  Filled 2023-03-23 (×4): qty 10

## 2023-03-23 MED ORDER — VITAL HIGH PROTEIN PO LIQD
1000.0000 mL | ORAL | Status: DC
  Administered 2023-03-23 – 2023-03-25 (×2): 1000 mL

## 2023-03-23 MED ORDER — PANTOPRAZOLE SODIUM 40 MG IV SOLR
40.0000 mg | INTRAVENOUS | Status: DC
  Administered 2023-03-23 – 2023-03-24 (×2): 40 mg via INTRAVENOUS
  Filled 2023-03-23 (×2): qty 10

## 2023-03-23 MED ORDER — POLYETHYLENE GLYCOL 3350 17 G PO PACK
17.0000 g | PACK | Freq: Every day | ORAL | Status: DC
  Administered 2023-03-23 – 2023-03-24 (×2): 17 g
  Filled 2023-03-23 (×2): qty 1

## 2023-03-23 MED ORDER — ADULT MULTIVITAMIN W/MINERALS CH
1.0000 | ORAL_TABLET | Freq: Every day | ORAL | Status: DC
  Administered 2023-03-23 – 2023-03-25 (×3): 1
  Filled 2023-03-23 (×3): qty 1

## 2023-03-23 MED ORDER — FOLIC ACID 1 MG PO TABS
1.0000 mg | ORAL_TABLET | Freq: Every day | ORAL | Status: DC
  Administered 2023-03-23 – 2023-03-25 (×3): 1 mg
  Filled 2023-03-23 (×2): qty 1

## 2023-03-23 NOTE — Progress Notes (Signed)
 SLP Cancellation Note  Patient Details Name: Marc Fischer MRN: [id number] DOB: 01-13-1951   Cancelled treatment:       Reason Eval/Treat Not Completed: Medical issues which prohibited therapy. Chart review completed. Pt remains intubated and is not appropriate for SLP intervention. SLP to sign off. Please re-consult s/p extubation for re-assessment.   [place] Amya Hlad Clapp MS [facility] SLP    [place] J Clapp 03/23/2023, 12:01 PM

## 2023-03-23 NOTE — Plan of Care (Signed)
  Problem: Ischemic Stroke/TIA Tissue Perfusion: Goal: Complications of ischemic stroke/TIA will be minimized Outcome: Progressing   Problem: Health Behavior/Discharge Planning: Goal: Goals will be collaboratively established with patient/family Outcome: Progressing   Problem: Nutrition: Goal: Dietary intake will improve Outcome: Progressing   Problem: Clinical Measurements: Goal: Ability to maintain clinical measurements within normal limits will improve Outcome: Progressing Goal: Diagnostic test results will improve Outcome: Progressing   Problem: Coping: Goal: Level of anxiety will decrease Outcome: Progressing   Problem: Elimination: Goal: Will not experience complications related to urinary retention Outcome: Progressing   Problem: Pain Managment: Goal: General experience of comfort will improve and/or be controlled Outcome: Progressing   Problem: Skin Integrity: Goal: Risk for impaired skin integrity will decrease Outcome: Progressing

## 2023-03-23 NOTE — Discharge Instructions (Addendum)
 Intensive Outpatient Programs   [facility] [address] [place],  [place], [place] [phone]  [facility] (Inpatient and outpatient)[phone] (Suboxone and Methadone) [address] [phone]  [facility] - Intensive Outpatient [address] [place], [place] [address]  [phone]  [facility] (Outpatient, Inpatient, [facility] (Groups and Residental) (insurance only) [phone] [place], [place] [phone]   [facility] (for caregivers and family) [address] Laurell Josephs [address] [place], [place] [phone]  Residential Treatment Programs  [facility](2 years) Residential: 32 days)[facility] [address] [place], [place], [place] [phone] or [phone]  [facility] [address] [place], [place], [place] [phone]  [facility] ([facility] [address] [place], [place], [place] [phone] Admissions: 8am-3pm M-F  [facility] Program: Residential Program [phone] Days)             [facility]: [facility]  [place], [place], [place]  [phone] or [phone] in Hours over the weekend or by referral)  [facility] [address] [place], [place] [address] [phone] (Do virtual or phone assessment, offer transportation within 25 miles, have in patient and Outpatient options)   Mobil Crisis: [facility]:[phone] (for crisis  response 24 hours a day)   Some PCP options in [place] area- not a comprehensive list  [facility]- [phone] [facility] [phone] [facility]- [phone] [facility]- [phone] [facility]- [phone] Lutricia Horsfall- [phone]  or [facility] Health Physician Referral Line [phone]

## 2023-03-23 NOTE — Progress Notes (Incomplete)
 Patient ID: Honorio Devol, male   DOB: 02/17/1951, 73 y.o.   MRN: [id number] [facility]    SUBJECTIVE: ***   Vitals:   03/23/23 0300 03/23/23 0400 03/23/23 0500 03/23/23 0600  BP: 99/70 100/67 100/67 97/68  Pulse: 69 72 72 71  Resp: 13 14 14 15   Temp: 98.1 F (36.7 C) (!) 97.5 F (36.4 C) (!) 97 F (36.1 C) (!) 97 F (36.1 C)  TempSrc:  Bladder    SpO2: 98% 100% 100% 100%  Weight:      Height:         Intake/Output Summary (Last 24 hours) at 03/23/2023 0719 Last data filed at 03/23/2023 0600 Gross per 24 hour  Intake 1827.3 ml  Output 758 ml  Net 1069.3 ml      PHYSICAL EXAM  General: Well developed, well nourished, in no acute distress HEENT:  Normocephalic and atramatic Neck:  No JVD.  Lungs: Clear bilaterally to auscultation and percussion. Heart: HRRR . Normal S1 and S2 without gallops or murmurs.  Abdomen: Bowel sounds are positive, abdomen soft and non-tender  Msk:  Back normal, normal gait. Normal strength and tone for age. Extremities: No clubbing, cyanosis or edema.   Neuro: Alert and oriented X 3. Psych:  Good affect, responds appropriately   LABS: Basic Metabolic Panel: Recent Labs    03/22/23 0527 03/23/23 0419  NA 140 137  K 4.1 4.5  CL 111 106  CO2 20* 23  GLUCOSE 130* 125*  BUN 28* 40*  CREATININE 1.25* 1.82*  CALCIUM 8.4* 8.1*  MG 2.1 2.7*  PHOS 1.9* 5.6*   Liver Function Tests: Recent Labs    03/21/23 2103 03/23/23 0419  AST 56* 85*  ALT 15 20  ALKPHOS 53 48  BILITOT 0.7 1.5*  PROT 5.9* 5.8*  ALBUMIN 3.2* 3.1*   No results for input(s): "LIPASE", "AMYLASE" in the last 72 hours. CBC: Recent Labs    03/22/23 0527 03/22/23 1049 03/22/23 2322 03/23/23 0419  WBC 11.4*  --   --  13.2*  NEUTROABS 10.3*  --   --   --   HGB 7.6*   < > 7.7* 7.6*  HCT 22.8*  --   --  22.7*  MCV 100.0  --   --  100.9*  PLT 171  --   --  180   < > = values in this interval not displayed.   Cardiac Enzymes: Recent Labs    03/22/23 1049  03/23/23 0419  CKTOTAL 765* 549*   BNP: Invalid input(s): "POCBNP" D-Dimer: No results for input(s): "DDIMER" in the last 72 hours. Hemoglobin A1C: No results for input(s): "HGBA1C" in the last 72 hours. Fasting Lipid Panel: No results for input(s): "CHOL", "HDL", "LDLCALC", "TRIG", "CHOLHDL", "LDLDIRECT" in the last 72 hours. Thyroid Function Tests: No results for input(s): "TSH", "T4TOTAL", "T3FREE", "THYROIDAB" in the last 72 hours.  Invalid input(s): "FREET3" Anemia Panel: Recent Labs    03/21/23 1740  VITAMINB12 200    CT ANGIO ABDOMEN W &/OR WO CONTRAST Result Date: 03/22/2023 CLINICAL DATA:  Splenic hematoma.  Evaluate for active bleeding. EXAM: CT ANGIOGRAPHY ABDOMEN TECHNIQUE: Multidetector CT imaging of the abdomen was performed using the standard protocol during bolus administration of intravenous contrast. Multiplanar reconstructed images and MIPs were obtained and reviewed to evaluate the vascular anatomy. RADIATION DOSE REDUCTION: This exam was performed according to the departmental dose-optimization program which includes automated exposure control, adjustment of the mA and/or kV according to patient size and/or use of  iterative reconstruction technique. CONTRAST:  OMNIPAQUE IOHEXOL 350 MG/ML SOLN COMPARISON:  03/21/2023 FINDINGS: VASCULAR Aorta: Normal caliber aorta without aneurysm, dissection, vasculitis or significant stenosis. Aortic atherosclerotic calcifications. Celiac: Patent without evidence of aneurysm, dissection, vasculitis or significant stenosis. Splenic artery is patent without signs of dissection or occlusion. No hyperdense focus of contrast extravasation to indicate site of active bleeding. SMA: Patent without evidence of aneurysm, dissection, vasculitis or significant stenosis. Renals: The left renal artery is patent. There is a nonocclusive, focal filling defect within the proximal right renal artery measuring 6 mm, image 89/17. This results in a short  segment of high-grade stenosis with flow identified distally. No signs of right renal infarction. IMA: Patent without evidence of aneurysm, dissection, vasculitis or significant stenosis. Inflow: Patent without evidence of aneurysm, dissection, vasculitis or significant stenosis. Veins: No obvious venous abnormality within the limitations of this arterial phase study. Review of the MIP images confirms the above findings. NON-VASCULAR Lower chest: Small bilateral pleural effusions and bilateral lower lobe atelectasis/consolidation. Hepatobiliary: Right-sided subcapsular hematoma is again noted. This appears similar in volume to the previous exam. Over the anterolateral right lobe this measures 1.4 cm in thickness, image 20/3. Previously 1.3 cm. Over the inferolateral margin this measures 1 cm in thickness, image 36/3. Previously 0.9 cm. Hematoma extending along the inferior medial margin of the liver measures 1.2 cm in thickness. Previously 0.9 cm. Contrast material in the gallbladder is again noted compatible with vicarious excretion. Pancreas: Unremarkable. No pancreatic ductal dilatation or surrounding inflammatory changes. Spleen: Are again I zinc splenic hematoma is again seen. This measures 5 cm in thickness over the lateral margin of the spleen, image 73/4. Previously this measured the same. Measured off the coronal images the largest portion of the hematoma measures 13.9 x 8.1 cm, image 75/6. Previously 14.1 x 7.7 cm. Adrenals/Urinary Tract: Normal adrenal glands. No nephrolithiasis, hydronephrosis or suspicious mass. Stomach/Bowel: There is a enteric tube with tip in the body of stomach. No dilated loops of large or small bowel identified. Lymphatic: No adenopathy identified. Other: No discrete fluid collections.  No signs of pneumoperitoneum. Musculoskeletal: There is an acute fracture involving the lateral aspect of the left tenth rib at the level of the splenic hematoma, image 78/14. Nondisplaced fracture  involving the anterior aspect of the left 6 rib also noted. Nondisplaced right twelfth rib fracture is also suspected but cannot be confirmed with a high degree of certainty. IMPRESSION: 1. No signs of active bleeding within the spleen. The splenic artery is patent without signs of dissection or occlusion. 2. There is a nonocclusive, focal filling defect within the proximal right renal artery measuring 6 mm. This results in a short segment of high-grade stenosis with flow identified distally. No signs of right renal infarction. 3. Stable size of splenic hematoma. 4. Stable size of right-sided subcapsular hematoma. 5. Small bilateral pleural effusions and bilateral lower lobe atelectasis/consolidation. 6. Acute fracture involving the lateral aspect of the left tenth rib at the level of the splenic hematoma. Nondisplaced fracture involving the anterior aspect of the left 6 rib also noted. Nondisplaced right twelfth rib fracture is also suspected but cannot be confirmed with a high degree of certainty. 7.  Aortic Atherosclerosis (ICD10-I70.0). 8. These results were called by telephone at the time of interpretation on 03/22/2023 at 6:35 pm to provider [facility] , who verbally acknowledged these results. Electronically Signed   By: Signa Kell M.D.   On: 03/22/2023 18:36   DG Chest Port 1  View Result Date: 03/22/2023 CLINICAL DATA:  Endotracheal tube placement. EXAM: PORTABLE CHEST 1 VIEW COMPARISON:  Same day. FINDINGS: Endotracheal and nasogastric tubes are in grossly good position. Stable left pleural effusion with associated atelectasis or infiltrate. IMPRESSION: Endotracheal and nasogastric tubes are in grossly good position. Electronically Signed   By: Lupita Raider M.D.   On: 03/22/2023 13:24   DG Chest Port 1 View Result Date: 03/22/2023 CLINICAL DATA:  Endotracheal tube placement. EXAM: PORTABLE CHEST 1 VIEW COMPARISON:  03/22/2023 and CT chest 03/21/2023. FINDINGS: Endotracheal tube terminates  approximately 1.4 cm above the carina. Nasogastric tube is followed into the stomach with the tip projecting beyond the inferior margin of the image. Heart is enlarged. Lungs are low in volume. Collapse/consolidation in the left lung base. There may be a small left pleural effusion. No low right basilar subsegmental atelectasis. IMPRESSION: 1. Endotracheal tube is low lying. Retracting approximately 3-4 cm should better position the tip above the carina. These results will be called to the ordering clinician or representative by the Radiologist Assistant, and communication documented in the PACS or [employer]. 2. Left basilar collapse/consolidation may be due to atelectasis or pneumonia. 3. Probable small left pleural effusion. Electronically Signed   By: Leanna Battles M.D.   On: 03/22/2023 12:57   DG Abd 1 View Result Date: 03/22/2023 CLINICAL DATA:  Endotracheal tube and nasogastric tube placement. EXAM: ABDOMEN - 1 VIEW COMPARISON:  03/19/2023. FINDINGS: Nasogastric tube terminates in the stomach, likely within the antrum. IMPRESSION: Nasogastric tube terminates in the stomach. Electronically Signed   By: Leanna Battles M.D.   On: 03/22/2023 12:55   DG Chest 1 View Result Date: 03/22/2023 CLINICAL DATA:  73 year old male with history of shortness of breath. Oxygen desaturations. EXAM: CHEST  1 VIEW COMPARISON:  Chest x-ray 03/19/2023. FINDINGS: Lung volumes are low. Increasing diffuse interstitial prominence, widespread peribronchial cuffing and patchy multifocal airspace disease throughout the lungs bilaterally, most confluent in the left base. Probable small left pleural effusion. No pneumothorax. No evidence of pulmonary edema. Heart size is borderline enlarged. The patient is rotated to the left on today's exam, resulting in distortion of the mediastinal contours and reduced diagnostic sensitivity and specificity for mediastinal pathology. Atherosclerotic calcifications are noted in the thoracic  aorta. IMPRESSION: 1. The appearance of the chest is concerning for multilobar bilateral pneumonia, most confluent in the left lung base, with probable small left pleural effusion. 2. Aortic atherosclerosis. Electronically Signed   By: Trudie Reed M.D.   On: 03/22/2023 07:56   CT CHEST ABDOMEN PELVIS WO CONTRAST Result Date: 03/21/2023 CLINICAL DATA:  Blunt abdominal trauma. Follow-up splenic hematoma. Drop in hemoglobin. Left-sided weakness. EXAM: CT CHEST, ABDOMEN AND PELVIS WITHOUT CONTRAST TECHNIQUE: Multidetector CT imaging of the chest, abdomen and pelvis was performed following the standard protocol without IV contrast. RADIATION DOSE REDUCTION: This exam was performed according to the departmental dose-optimization program which includes automated exposure control, adjustment of the mA and/or kV according to patient size and/or use of iterative reconstruction technique. COMPARISON:  Chest radiograph 03/19/2023. CT abdomen and pelvis 03/19/2023. FINDINGS: CT CHEST FINDINGS Cardiovascular: Mild cardiac enlargement. No pericardial effusions. Normal caliber thoracic aorta. Calcification of the aorta and coronary arteries. Mediastinum/Nodes: Esophagus is decompressed. No significant lymphadenopathy. Thyroid gland is unremarkable. Lungs/Pleura: Motion artifact limits examination. There is atelectasis or consolidation in both lower lungs. No visible pleural effusion or pneumothorax. Musculoskeletal: Acute appearing fractures of the anterolateral left third through tenth ribs. Sternum and spine appear intact.  CT ABDOMEN PELVIS FINDINGS Hepatobiliary: Subcapsular hematoma along the right lobe of the liver measuring 1.7 cm depth. This is progressing since the previous study. Increased density in the gallbladder likely representing vicarious excretion of contrast material. No bile duct dilatation. Pancreas: Unremarkable. No pancreatic ductal dilatation or surrounding inflammatory changes. Spleen: Organizing  splenic hematoma measuring 4.2 cm depth, similar to prior study. Adrenals/Urinary Tract: Adrenal glands are unremarkable. Kidneys are normal, without renal calculi, focal lesion, or hydronephrosis. Bladder is unremarkable. Stomach/Bowel: Stomach, small bowel, and colon are not abnormally distended. No wall thickening or inflammatory changes. Vascular/Lymphatic: Aortic atherosclerosis. No enlarged abdominal or pelvic lymph nodes. Reproductive: Prostate is unremarkable. Other: Free fluid in the abdomen and pelvis with increased density consistent with hemorrhagic ascites. This appears mildly increased since the prior study. No free intra-abdominal air. Musculoskeletal: Degenerative changes in the spine. No acute bony abnormalities. IMPRESSION: 1. Multiple acute left rib fractures. 2. Atelectasis or consolidation in both lower lungs, progressing since prior study. 3. Progressing subcapsular hematoma in the liver. 4. Organizing splenic subcapsular hematoma similar to prior study. 5. Increased density in the gallbladder consistent with vicarious excretion of contrast material. 6. Hemoperitoneum, mildly increased since prior study. 7. Aortic atherosclerosis. These results were called by telephone at the time of interpretation on 03/21/2023 at 11:18 pm to provider [facility] , who verbally acknowledged these results. Electronically Signed   By: Burman Nieves M.D.   On: 03/21/2023 23:24   CT HEAD WO CONTRAST ( ) Result Date: 03/21/2023 CLINICAL DATA:  Follow-up stroke.  Left-sided weakness. EXAM: CT HEAD WITHOUT CONTRAST TECHNIQUE: Contiguous axial images were obtained from the base of the skull through the vertex without intravenous contrast. RADIATION DOSE REDUCTION: This exam was performed according to the departmental dose-optimization program which includes automated exposure control, adjustment of the mA and/or kV according to patient size and/or use of iterative reconstruction technique. COMPARISON:  MRI  brain 03/19/2023.  CT head 03/19/2023 FINDINGS: Brain: Diffuse cerebral atrophy. Mild ventricular dilatation consistent with central atrophy. Low-attenuation changes in the deep white matter consistent with small vessel ischemia. Focal area of low-attenuation in the right thalamus and basal ganglia corresponding to acute infarct seen on prior MRI. Similar appearance to previous study. No developing mass effect or intracranial hemorrhage. Vascular: No hyperdense vessel or unexpected calcification. Skull: Normal. Negative for fracture or focal lesion. Sinuses/Orbits: No acute finding. Other: None. IMPRESSION: Focal low-attenuation in the right basal ganglia and thalamus corresponding to known acute infarct. No significant change since prior study. No developing mass effect or intracranial hemorrhage. Chronic atrophy and small vessel ischemic changes. Electronically Signed   By: Burman Nieves M.D.   On: 03/21/2023 23:10   ECHOCARDIOGRAM COMPLETE Result Date: 03/21/2023    ECHOCARDIOGRAM REPORT   Patient Name:   JHALIL SILVERA Date of Exam: 03/21/2023 Medical Rec #:  [id number]      Height:       70.0 in Accession #:    [id number]     Weight:       212.0 lb Date of Birth:  03-06-1950     BSA:          2.140 m Patient Age:    72 years       BP:           166/88 mmHg Patient Gender: M              HR:           93 bpm. Exam Location:  [facility]  Procedure: 2D Echo Indications:     Stroke I63.9  History:         Patient has no prior history of Echocardiogram examinations.  Sonographer:     Elwin Sleight RDCS Referring Phys:  [id number] Lorretta Harp Diagnosing Phys: Jodelle Red MD  Sonographer Comments: Technically difficult study due to poor echo windows and suboptimal subcostal window. Image acquisition challenging due to patient behavioral factors., Image acquisition challenging due to uncooperative patient and Image acquisition  challenging due to respiratory motion. IMPRESSIONS  1. Left ventricular ejection fraction, by  estimation, is 55 to 60%. The left ventricle has normal function. The left ventricle has no regional wall motion abnormalities. Left ventricular diastolic parameters are indeterminate.  2. Right ventricular systolic function is normal. The right ventricular size is normal.  3. Left atrial size was mildly dilated.  4. The mitral valve is normal in structure. Trivial mitral valve regurgitation. No evidence of mitral stenosis.  5. The aortic valve has an indeterminant number of cusps. There is mild calcification of the aortic valve. There is mild thickening of the aortic valve. Aortic valve regurgitation is not visualized. Aortic valve sclerosis/calcification is present, without any evidence of aortic stenosis.  6. Aortic dilatation noted. There is borderline dilatation of the ascending aorta, measuring 39 mm.  7. The inferior vena cava is normal in size with greater than 50% respiratory variability, suggesting right atrial pressure of 3 mmHg. Comparison(s): No prior Echocardiogram. Conclusion(s)/Recommendation(s): Normal biventricular function without evidence of hemodynamically significant valvular heart disease. No intracardiac source of embolism detected on this transthoracic study. Consider a transesophageal echocardiogram to exclude cardiac source of embolism if clinically indicated. FINDINGS  Left Ventricle: Left ventricular ejection fraction, by estimation, is 55 to 60%. The left ventricle has normal function. The left ventricle has no regional wall motion abnormalities. The left ventricular internal cavity size was normal in size. There is  no left ventricular hypertrophy. Left ventricular diastolic parameters are indeterminate. Right Ventricle: The right ventricular size is normal. No increase in right ventricular wall thickness. Right ventricular systolic function is normal. Left Atrium: Left atrial size was mildly dilated. Right Atrium: Right atrial size was normal in size. Pericardium: There is no evidence  of pericardial effusion. Mitral Valve: The mitral valve is normal in structure. Trivial mitral valve regurgitation. No evidence of mitral valve stenosis. Tricuspid Valve: The tricuspid valve is normal in structure. Tricuspid valve regurgitation is trivial. No evidence of tricuspid stenosis. Aortic Valve: The aortic valve has an indeterminant number of cusps. There is mild calcification of the aortic valve. There is mild thickening of the aortic valve. Aortic valve regurgitation is not visualized. Aortic valve sclerosis/calcification is present, without any evidence of aortic stenosis. Aortic valve peak gradient measures 9.6 mmHg. Pulmonic Valve: The pulmonic valve was not well visualized. Pulmonic valve regurgitation is not visualized. No evidence of pulmonic stenosis. Aorta: Aortic dilatation noted. There is borderline dilatation of the ascending aorta, measuring 39 mm. Venous: The inferior vena cava is normal in size with greater than 50% respiratory variability, suggesting right atrial pressure of 3 mmHg. IAS/Shunts: The atrial septum is grossly normal.  LEFT VENTRICLE PLAX 2D LVIDd:         4.90 cm Diastology LVIDs:         3.50 cm LV e' medial:    7.96 cm/s LV PW:         1.00 cm LV E/e' medial:  9.9 LV IVS:        1.10 cm  LV e' lateral:   6.47 cm/s                        LV E/e' lateral: 12.2  RIGHT VENTRICLE RV Basal diam:  2.90 cm RV S prime:     17.90 cm/s TAPSE (M-mode): 2.3 cm LEFT ATRIUM           Index        RIGHT ATRIUM           Index LA diam:      4.10 cm 1.92 cm/m   RA Area:     14.20 cm LA Vol (A2C): 85.7 ml 40.05 ml/m  RA Volume:   36.10 ml  16.87 ml/m LA Vol (A4C): 37.2 ml 17.39 ml/m  AORTIC VALVE              PULMONIC VALVE AV Vmax:      155.00 cm/s PV Vmax:        1.38 m/s AV Peak Grad: 9.6 mmHg    PV Peak grad:   7.6 mmHg LVOT Vmax:    114.00 cm/s RVOT Peak grad: 3 mmHg LVOT Vmean:   76.900 cm/s LVOT VTI:     0.188 m  AORTA Ao Asc diam: 3.90 cm MITRAL VALVE MV Area (PHT): 4.41 cm     SHUNTS MV Decel Time: 172 msec    Systemic VTI: 0.19 m MV E velocity: 78.90 cm/s MV A velocity: 92.20 cm/s MV E/A ratio:  0.86 Bridgette Christopher MD Electronically signed by Jodelle Red MD Signature Date/Time: 03/21/2023/1:44:46 PM    Final      Echo ***  TELEMETRY: ***:  ASSESSMENT AND PLAN:  Principal Problem:   Unresponsiveness Active Problems:   HTN (hypertension)   HLD (hyperlipidemia)   Stroke (HCC)   Tobacco abuse   Fall at home, initial encounter   Hypokalemia   AKI (acute kidney injury) (HCC)   Metabolic acidosis   Obesity (BMI 30-39.9)   Abdominal distension   Aspiration pneumonia (HCC)   Alcohol abuse   Splenic hemorrhage_subscapular splenic hematoma    1. ***   Alwyn Pea, MD, PHD [facility] 03/23/2023 7:19 AM

## 2023-03-23 NOTE — Progress Notes (Addendum)
 Patient's caregiver states that patient's sister, Noralee Stain, resides in [place] and has an international phone number. He provided her contact information as follows...   Contact Information- Noralee Stain [phone] [email or web address]

## 2023-03-23 NOTE — TOC CM/SW Note (Signed)
 Transition of Care [facility]) - Inpatient Brief Assessment   Patient Details  Name: Marc Fischer MRN: [id number] Date of Birth: 1951/01/22  Transition of [facility]) CM/SW Contact:    Margarito Liner, LCSW Phone Number: 03/23/2023, 2:36 PM   Clinical Narrative: CSW reviewed chart. Patient is currently intubated. CSW will follow progress to determine SNF needs once appropriate. CSW added SA resources and PCP list to his AVS.   Transition of Care Asessment: Insurance and Status: Insurance coverage has been reviewed Patient has primary care physician: No Home environment has been reviewed: Single family home Prior level of function:: Unknown Prior/Current Home Services: No current home services Social Drivers of Health Review: SDOH reviewed no interventions necessary Readmission risk has been reviewed: Yes Transition of care needs: transition of care needs identified, TOC will continue to follow
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
## 2023-03-23 NOTE — IPAL (Signed)
  Interdisciplinary Goals of Care Family Meeting   Date carried out: 03/23/2023  Location of the meeting: Phone conference  Member's involved: Physician and Family Member or next of kin  Durable Power of Attorney or acting medical decision maker: Sister Marc Fischer.  Discussion: We discussed goals of care for [name]. I updated [name] regarding the condition of her brother, with respiratory failure, hemorrhagic shock, AKI, NSTEMI, and CVA. Explained that he is currently intubated and mechanically ventilated. Sister relayed that her brother had previously expressed wishes to be DNR. She also described his lifestyle of severe alcohol use and multi-drug use over the past 30 years. She resides in the [place], but is currently in [place] visiting friends. She reported no other next of kin besides her. We agreed to continue care as is and re-evaluate in the AM. She might present to visit.  Overall sister is aware that the patient is critically ill with multi-organ failure that represents threat to life.  Code status:   Code Status: Full Code   Disposition: Continue current acute care  Time spent for the meeting: 20 minutes    Raechel Chute, MD  03/23/2023, 6:00 PM

## 2023-03-23 NOTE — Progress Notes (Signed)
 PT Cancellation Note  Patient Details Name: Marc Fischer MRN: [id number] DOB: 1951/02/03   Cancelled Treatment:    Reason Eval/Treat Not Completed: Medical issues which prohibited therapy (Patient has been intubated and is sedated since last PT session. Spoke at bedside with the nurse. Not currently appropriate for PT. Please re-consult when ready.)  Donna Bernard, PT, MPT  Ina Homes 03/23/2023, 10:01 AM
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
## 2023-03-24 ENCOUNTER — Inpatient Hospital Stay: Payer: Medicare HMO

## 2023-03-24 DIAGNOSIS — I639 Cerebral infarction, unspecified: Secondary | ICD-10-CM | POA: Diagnosis not present

## 2023-03-24 DIAGNOSIS — J9601 Acute respiratory failure with hypoxia: Secondary | ICD-10-CM | POA: Diagnosis not present

## 2023-03-24 DIAGNOSIS — F10150 Alcohol abuse with alcohol-induced psychotic disorder with delusions: Secondary | ICD-10-CM | POA: Diagnosis not present

## 2023-03-24 DIAGNOSIS — D735 Infarction of spleen: Secondary | ICD-10-CM | POA: Diagnosis not present

## 2023-03-24 LAB — METHYLMALONIC ACID, SERUM: Methylmalonic Acid, Quantitative: 326 nmol/L (ref 0–378)

## 2023-03-24 LAB — RESPIRATORY PANEL BY PCR

## 2023-03-24 LAB — MAGNESIUM: Magnesium: 2.8 mg/dL — ABNORMAL HIGH (ref 1.7–2.4)

## 2023-03-24 LAB — COMPREHENSIVE METABOLIC PANEL
ALT: 24 U/L (ref 0–44)
AST: 69 U/L — ABNORMAL HIGH (ref 15–41)
Albumin: 3.1 g/dL — ABNORMAL LOW (ref 3.5–5.0)
Alkaline Phosphatase: 55 U/L (ref 38–126)
Anion gap: 12 (ref 5–15)
BUN: 48 mg/dL — ABNORMAL HIGH (ref 8–23)
CO2: 21 mmol/L — ABNORMAL LOW (ref 22–32)
Calcium: 8.3 mg/dL — ABNORMAL LOW (ref 8.9–10.3)
Chloride: 109 mmol/L (ref 98–111)
Creatinine, Ser: 1.76 mg/dL — ABNORMAL HIGH (ref 0.61–1.24)
GFR, Estimated: 41 mL/min — ABNORMAL LOW (ref >60)
Glucose, Bld: 94 mg/dL (ref 70–99)
Potassium: 4 mmol/L (ref 3.5–5.1)
Sodium: 142 mmol/L (ref 135–145)
Total Bilirubin: 0.7 mg/dL (ref 0.0–1.2)
Total Protein: 6 g/dL — ABNORMAL LOW (ref 6.5–8.1)

## 2023-03-24 LAB — CBC
HCT: 24.5 % — ABNORMAL LOW (ref 39.0–52.0)
HCT: 24.3 % — ABNORMAL LOW (ref 39.0–52.0)
Hemoglobin: 7.8 g/dL — ABNORMAL LOW (ref 13.0–17.0)
Hemoglobin: 7.9 g/dL — ABNORMAL LOW (ref 13.0–17.0)
MCH: 33.2 pg (ref 26.0–34.0)
MCH: 33.1 pg (ref 26.0–34.0)
MCHC: 31.8 g/dL (ref 30.0–36.0)
MCHC: 32.5 g/dL (ref 30.0–36.0)
MCV: 104.3 fL — ABNORMAL HIGH (ref 80.0–100.0)
MCV: 101.7 fL — ABNORMAL HIGH (ref 80.0–100.0)
Platelets: 195 10*3/uL (ref 150–400)
Platelets: 197 10*3/uL (ref 150–400)
RBC: 2.35 MIL/uL — ABNORMAL LOW (ref 4.22–5.81)
RBC: 2.39 MIL/uL — ABNORMAL LOW (ref 4.22–5.81)
RDW: 14.4 % (ref 11.5–15.5)
RDW: 14.3 % (ref 11.5–15.5)
WBC: 9.7 10*3/uL (ref 4.0–10.5)
WBC: 10.6 10*3/uL — ABNORMAL HIGH (ref 4.0–10.5)
nRBC: 0.3 % — ABNORMAL HIGH (ref 0.0–0.2)
nRBC: 0.4 % — ABNORMAL HIGH (ref 0.0–0.2)

## 2023-03-24 LAB — PHOSPHORUS: Phosphorus: 5 mg/dL — ABNORMAL HIGH (ref 2.5–4.6)

## 2023-03-24 LAB — GLUCOSE, CAPILLARY
Glucose-Capillary: 100 mg/dL — ABNORMAL HIGH (ref 70–99)
Glucose-Capillary: 117 mg/dL — ABNORMAL HIGH (ref 70–99)
Glucose-Capillary: 119 mg/dL — ABNORMAL HIGH (ref 70–99)
Glucose-Capillary: 126 mg/dL — ABNORMAL HIGH (ref 70–99)
Glucose-Capillary: 55 mg/dL — ABNORMAL LOW (ref 70–99)
Glucose-Capillary: 77 mg/dL (ref 70–99)
Glucose-Capillary: 82 mg/dL (ref 70–99)
Glucose-Capillary: 91 mg/dL (ref 70–99)

## 2023-03-24 LAB — HEMOGLOBIN
Hemoglobin: 7.9 g/dL — ABNORMAL LOW (ref 13.0–17.0)
Hemoglobin: 7.1 g/dL — ABNORMAL LOW (ref 13.0–17.0)

## 2023-03-24 MED ORDER — PROPOFOL 1000 MG/100ML IV EMUL
0.0000 ug/kg/min | INTRAVENOUS | Status: DC
  Administered 2023-03-25: 50 ug/kg/min via INTRAVENOUS
  Administered 2023-03-25 (×2): 40 ug/kg/min via INTRAVENOUS
  Filled 2023-03-24 (×7): qty 100

## 2023-03-24 MED ORDER — DEXTROSE 50 % IV SOLN
INTRAVENOUS | Status: AC
  Administered 2023-03-24: 50 mL
  Filled 2023-03-24: qty 50

## 2023-03-24 MED ORDER — HEPARIN SODIUM (PORCINE) 5000 UNIT/ML IJ SOLN
5000.0000 [IU] | Freq: Two times a day (BID) | INTRAMUSCULAR | Status: DC
  Administered 2023-03-24 (×2): 5000 [IU] via SUBCUTANEOUS
  Filled 2023-03-24 (×2): qty 1

## 2023-03-24 MED ORDER — ACETAMINOPHEN 325 MG PO TABS: 650.0000 mg | ORAL_TABLET | Freq: Four times a day (QID) | ORAL | Status: DC | PRN

## 2023-03-24 MED ORDER — FAMOTIDINE 20 MG PO TABS
20.0000 mg | ORAL_TABLET | Freq: Two times a day (BID) | ORAL | Status: DC
  Administered 2023-03-24 – 2023-03-25 (×2): 20 mg
  Filled 2023-03-24 (×2): qty 1

## 2023-03-24 NOTE — Progress Notes (Signed)
 Transported to CT and back with no events.

## 2023-03-24 NOTE — Progress Notes (Addendum)
 Acute Stroke Patient with acute CVA in right basal ganglia on admission with symptoms of Left sided weakness starting 48 hours prior to admission. Initial NIHSS: 6. Not a candidate for tNKase due to unknown timeframe and recent splenic hematoma. 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 Similar moderate to severe left and moderate right vertebral artery origin stenosis. Similar moderate right M1 MCA stenosis - Neuro checks Q 4 - NIHSS Q shift - Neurology re-consulted, appreciate input - f/u MRI brain - SCD's for VTE prophylaxis   Sister, Arland, updated on current clinical status.   Jenita Ruth Rust-Chester, AGACNP-BC Acute Care Nurse Practitioner [facility]   [phone] / [phone] Please see Amion for details.

## 2023-03-24 NOTE — Plan of Care (Signed)
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
## 2023-03-24 NOTE — Progress Notes (Signed)
 Performed [facility] pt unresponsive when arrived on shift, was able to turn prop and fentanyl  completely off pt still unresponsive, per MD started propofol  back at a low dose of 10 just because patient was double stacking and belly breathing. Will continue to monitor
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
## 2023-03-24 NOTE — IPAL (Signed)
  Interdisciplinary Goals of Care Family Meeting   Date carried out: 03/24/2023  Location of the meeting: Phone conference  Member's involved: Physician and Family Member or next of kin  Durable Power of Attorney or acting medical decision maker: Sister Marc Fischer  Discussion: We discussed goals of care for Marc Fischer. Discussed over all goals of care with the patient's sister over the phone. She relayed that he would not have wanted to be intubated to begin with, and would not want chest compressions to restart his heart should it stop. He had relayed to her that he would not have wanted to be intubated or resuscitated. Again went over the medical plan of care and the multi-organ failure.  Code status:   Code Status: Change to DNR/DNI  Disposition: Continue current acute care  Time spent for the meeting: 15 minutes    Belva November, MD  03/24/2023, 3:22 PM

## 2023-03-24 NOTE — Progress Notes (Signed)
 OT Cancellation Note  Patient Details Name: Marc Fischer MRN: [id number] DOB: Jun 22, 1950   Cancelled Treatment:    Reason Eval/Treat Not Completed: Medical issues which prohibited therapy. Pt has had a change in medical status, currently in the ICU and intubated. Not currently appropriate for OT. Please re-consult when ready.    Yasamin Karel L. Aristides Luckey, OTR/L  03/24/23, 8:24 AM

## 2023-03-24 NOTE — Progress Notes (Signed)
 [facility]    SUBJECTIVE: Intubated sedated not responsive   Vitals:   03/24/23 1600 03/24/23 1601 03/24/23 1700 03/24/23 1800  BP: (!) 142/80  (!) 162/92 130/79  Pulse: 86  94 87  Resp: 19  (!) 25 18  Temp: (!) 100.9 F (38.3 C)  (!) 100.9 F (38.3 C) (!) 100.8 F (38.2 C)  TempSrc: Bladder  Bladder Bladder  SpO2: 98% 100% 100% 98%  Weight:      Height:         Intake/Output Summary (Last 24 hours) at 03/24/2023 1856 Last data filed at 03/24/2023 1823 Gross per 24 hour  Intake 2196.76 ml  Output 1430 ml  Net 766.76 ml      PHYSICAL EXAM  General: Well developed, well nourished, in no acute distress HEENT:  Normocephalic and atramatic Neck:  No JVD.  Lungs: Clear bilaterally to auscultation and percussion. Heart: HRRR . Normal S1 and S2 without gallops or murmurs.  Abdomen: Bowel sounds are positive, abdomen soft and non-tender  Msk:  Back normal, normal gait. Normal strength and tone for age. Extremities: No clubbing, cyanosis or edema.   Neuro: Unresponsive Psych: Sedated on the vent   LABS: Basic Metabolic Panel: Recent Labs    03/23/23 0419 03/24/23 0348  NA 137 142  K 4.5 4.0  CL 106 109  CO2 23 21*  GLUCOSE 125* 94  BUN 40* 48*  CREATININE 1.82* 1.76*  CALCIUM  8.1* 8.3*  MG 2.7* 2.8*  PHOS 5.6* 5.0*   Liver Function Tests: Recent Labs    03/23/23 0419 03/24/23 0348  AST 85* 69*  ALT 20 24  ALKPHOS 48 55  BILITOT 1.5* 0.7  PROT 5.8* 6.0*  ALBUMIN 3.1* 3.1*   No results for input(s): LIPASE, AMYLASE in the last 72 hours. CBC: Recent Labs    03/22/23 0527 03/22/23 1049 03/24/23 0348 03/24/23 1354 03/24/23 1409  WBC 11.4*   < > 9.7 10.6*  --   NEUTROABS 10.3*  --   --   --   --   HGB 7.6*   < > 7.8* 7.9* 7.9*  HCT 22.8*   < > 24.5* 24.3*  --   MCV 100.0   < > 104.3* 101.7*  --   PLT 171   < > 195 197  --    < > = values in this interval not displayed.   Cardiac Enzymes: Recent Labs    03/22/23 1049 03/23/23 0419   CKTOTAL 765* 549*   BNP: Invalid input(s): POCBNP D-Dimer: No results for input(s): DDIMER in the last 72 hours. Hemoglobin A1C: No results for input(s): HGBA1C in the last 72 hours. Fasting Lipid Panel: No results for input(s): CHOL, HDL, LDLCALC, TRIG, CHOLHDL, LDLDIRECT in the last 72 hours. Thyroid Function Tests: No results for input(s): TSH, T4TOTAL, T3FREE, THYROIDAB in the last 72 hours.  Invalid input(s): FREET3 Anemia Panel: No results for input(s): VITAMINB12, FOLATE, FERRITIN, TIBC, IRON, RETICCTPCT in the last 72 hours.  DG Chest Port 1 View Result Date: 03/23/2023 CLINICAL DATA:  History of endotracheal tube EXAM: PORTABLE CHEST 1 VIEW COMPARISON:  Chest x-ray 03/22/2023 FINDINGS: The endotracheal tube tip is 4 cm above the carina. The enteric tube extends below the diaphragm. Heart is enlarged, unchanged. There is left basilar patchy opacity and likely small left pleural effusion. IMPRESSION: 1. Endotracheal tube tip 4 cm above the carina. 2. Left basilar patchy opacity and likely small left pleural effusion. Electronically Signed   By: Greig Pique  M.D.   On: 03/23/2023 15:09     Echo echocardiogram preserved left ventricular function EF of 60  TELEMETRY: Sinus rhythm at 90 nonspecific EKG changes:  ASSESSMENT AND PLAN:  Principal Problem:   Unresponsiveness Active Problems:   HTN (hypertension)   HLD (hyperlipidemia)   Stroke (HCC)   Tobacco abuse   Fall at home, initial encounter   Hypokalemia   AKI (acute kidney injury) (HCC)   Metabolic acidosis   Obesity (BMI 30-39.9)   Abdominal distension   Aspiration pneumonia (HCC)   Alcohol abuse   Splenic hemorrhage_subscapular splenic hematoma   Hemorrhagic shock (HCC)   Alcohol use   Acute respiratory failure with hypoxia (HCC)   Plan Acute respiratory failure secondary to the trauma hemorrhagic shock CVA continue pulmonary and critical care support Patient  currently on complete ventilatory support as per critical care pulmonary continue respiratory support and management Possible non-STEMI peak troponin of 10,000 this may all be related to trauma.  Avoid anticoagulants like heparin  for now Echocardiogram for assessment of left ventricular function wall motion which shows preserved left ventricular function abnormalities with elevated troponin Currently do not recommend invasive procedure with all of his recent injuries and hemoperitoneum Continue current therapy for alcohol substance abuse withdrawal No invasive cardiac procedures recommended until patient stabilizes   Marc JONETTA Lovelace, MD,  03/24/2023 6:56 PM

## 2023-03-25 ENCOUNTER — Ambulatory Visit: Payer: Medicare HMO

## 2023-03-25 ENCOUNTER — Inpatient Hospital Stay: Payer: Medicare HMO

## 2023-03-25 DIAGNOSIS — R569 Unspecified convulsions: Secondary | ICD-10-CM

## 2023-03-25 DIAGNOSIS — R4189 Other symptoms and signs involving cognitive functions and awareness: Secondary | ICD-10-CM | POA: Diagnosis not present

## 2023-03-25 DIAGNOSIS — R571 Hypovolemic shock: Secondary | ICD-10-CM

## 2023-03-25 DIAGNOSIS — J9601 Acute respiratory failure with hypoxia: Secondary | ICD-10-CM | POA: Diagnosis not present

## 2023-03-25 DIAGNOSIS — I6389 Other cerebral infarction: Secondary | ICD-10-CM | POA: Diagnosis not present

## 2023-03-25 DIAGNOSIS — I639 Cerebral infarction, unspecified: Secondary | ICD-10-CM | POA: Diagnosis not present

## 2023-03-25 DIAGNOSIS — G9341 Metabolic encephalopathy: Secondary | ICD-10-CM | POA: Diagnosis not present

## 2023-03-25 DIAGNOSIS — D735 Infarction of spleen: Secondary | ICD-10-CM | POA: Diagnosis not present

## 2023-03-25 LAB — CBC
HCT: 24.2 % — ABNORMAL LOW (ref 39.0–52.0)
HCT: 23.5 % — ABNORMAL LOW (ref 39.0–52.0)
Hemoglobin: 8 g/dL — ABNORMAL LOW (ref 13.0–17.0)
Hemoglobin: 7.7 g/dL — ABNORMAL LOW (ref 13.0–17.0)
MCH: 33.1 pg (ref 26.0–34.0)
MCH: 33 pg (ref 26.0–34.0)
MCHC: 33.1 g/dL (ref 30.0–36.0)
MCHC: 32.8 g/dL (ref 30.0–36.0)
MCV: 100 fL (ref 80.0–100.0)
MCV: 100.9 fL — ABNORMAL HIGH (ref 80.0–100.0)
Platelets: 214 10*3/uL (ref 150–400)
Platelets: 222 10*3/uL (ref 150–400)
RBC: 2.42 MIL/uL — ABNORMAL LOW (ref 4.22–5.81)
RBC: 2.33 MIL/uL — ABNORMAL LOW (ref 4.22–5.81)
RDW: 14.1 % (ref 11.5–15.5)
RDW: 14.2 % (ref 11.5–15.5)
WBC: 9.5 10*3/uL (ref 4.0–10.5)
WBC: 9.1 10*3/uL (ref 4.0–10.5)
nRBC: 0 % (ref 0.0–0.2)
nRBC: 0 % (ref 0.0–0.2)

## 2023-03-25 LAB — CULTURE, BLOOD (ROUTINE X 2)
Culture: NO GROWTH
Culture: NO GROWTH
Special Requests: ADEQUATE

## 2023-03-25 LAB — PHOSPHORUS: Phosphorus: 3.4 mg/dL (ref 2.5–4.6)

## 2023-03-25 LAB — TROPONIN I (HIGH SENSITIVITY)
Troponin I (High Sensitivity): 11576 ng/L (ref <18)
Troponin I (High Sensitivity): 9911 ng/L (ref <18)

## 2023-03-25 LAB — COMPREHENSIVE METABOLIC PANEL
ALT: 25 U/L (ref 0–44)
AST: 69 U/L — ABNORMAL HIGH (ref 15–41)
Albumin: 3.1 g/dL — ABNORMAL LOW (ref 3.5–5.0)
Alkaline Phosphatase: 65 U/L (ref 38–126)
Anion gap: 8 (ref 5–15)
BUN: 52 mg/dL — ABNORMAL HIGH (ref 8–23)
CO2: 24 mmol/L (ref 22–32)
Calcium: 8.5 mg/dL — ABNORMAL LOW (ref 8.9–10.3)
Chloride: 110 mmol/L (ref 98–111)
Creatinine, Ser: 1.22 mg/dL (ref 0.61–1.24)
GFR, Estimated: >60 mL/min (ref >60)
Glucose, Bld: 114 mg/dL — ABNORMAL HIGH (ref 70–99)
Potassium: 3.3 mmol/L — ABNORMAL LOW (ref 3.5–5.1)
Sodium: 142 mmol/L (ref 135–145)
Total Bilirubin: 1.2 mg/dL (ref 0.0–1.2)
Total Protein: 6 g/dL — ABNORMAL LOW (ref 6.5–8.1)

## 2023-03-25 LAB — GLUCOSE, CAPILLARY
Glucose-Capillary: 96 mg/dL (ref 70–99)
Glucose-Capillary: 105 mg/dL — ABNORMAL HIGH (ref 70–99)
Glucose-Capillary: 120 mg/dL — ABNORMAL HIGH (ref 70–99)
Glucose-Capillary: 101 mg/dL — ABNORMAL HIGH (ref 70–99)

## 2023-03-25 LAB — MAGNESIUM: Magnesium: 2.4 mg/dL (ref 1.7–2.4)

## 2023-03-25 LAB — HEMOGLOBIN: Hemoglobin: 7.7 g/dL — ABNORMAL LOW (ref 13.0–17.0)

## 2023-03-25 MED ORDER — PIPERACILLIN-TAZOBACTAM 3.375 G IVPB
3.3750 g | Freq: Three times a day (TID) | INTRAVENOUS | Status: DC
Start: 2023-03-25 — End: 2023-03-25
  Administered 2023-03-25: 3.375 g via INTRAVENOUS
  Filled 2023-03-25: qty 50

## 2023-03-25 MED ORDER — POTASSIUM CHLORIDE 20 MEQ PO PACK
40.0000 meq | PACK | Freq: Once | ORAL | Status: AC
Start: 2023-03-25 — End: 2023-03-25
  Administered 2023-03-25: 40 meq
  Filled 2023-03-25: qty 2

## 2023-03-25 MED ORDER — POTASSIUM CHLORIDE 10 MEQ/100ML IV SOLN
10.0000 meq | INTRAVENOUS | Status: AC
  Administered 2023-03-25 (×4): 10 meq via INTRAVENOUS
  Filled 2023-03-25 (×4): qty 100

## 2023-03-25 MED ORDER — MORPHINE 100MG IN NS 100ML (1MG/ML) PREMIX INFUSION
0.0000 mg/h | INTRAVENOUS | Status: DC
  Administered 2023-03-25: 10 mg/h via INTRAVENOUS
  Administered 2023-03-25: 5 mg/h via INTRAVENOUS
  Filled 2023-03-25 (×3): qty 100

## 2023-03-25 MED ORDER — VANCOMYCIN HCL 1250 MG/250ML IV SOLN: 1250.0000 mg | INTRAVENOUS | Status: DC

## 2023-03-25 MED ORDER — SODIUM CHLORIDE 0.9% FLUSH
3.0000 mL | Freq: Two times a day (BID) | INTRAVENOUS | Status: DC
  Administered 2023-03-25: 3 mL via INTRAVENOUS

## 2023-03-25 MED ORDER — ASPIRIN 81 MG PO CHEW: 81.0000 mg | CHEWABLE_TABLET | Freq: Every day | ORAL | Status: DC

## 2023-03-25 MED ORDER — ACETAMINOPHEN 650 MG RE SUPP: 650.0000 mg | Freq: Four times a day (QID) | RECTAL | Status: DC | PRN

## 2023-03-25 MED ORDER — FENTANYL CITRATE PF 50 MCG/ML IJ SOSY
25.0000 ug | PREFILLED_SYRINGE | INTRAMUSCULAR | Status: DC | PRN
  Administered 2023-03-25 (×2): 100 ug via INTRAVENOUS
  Filled 2023-03-25 (×2): qty 2
  Filled 2023-03-25: qty 1

## 2023-03-25 MED ORDER — FENTANYL CITRATE PF 50 MCG/ML IJ SOSY
25.0000 ug | PREFILLED_SYRINGE | INTRAMUSCULAR | Status: DC | PRN
  Filled 2023-03-25: qty 1

## 2023-03-25 MED ORDER — SODIUM CHLORIDE 0.9% FLUSH: 3.0000 mL | INTRAVENOUS | Status: DC | PRN

## 2023-03-25 MED ORDER — GLYCOPYRROLATE 0.2 MG/ML IJ SOLN
0.2000 mg | INTRAMUSCULAR | Status: DC | PRN
  Administered 2023-03-25 – 2023-03-26 (×3): 0.2 mg via INTRAVENOUS
  Filled 2023-03-25 (×3): qty 1

## 2023-03-25 MED ORDER — LORAZEPAM 2 MG/ML IJ SOLN
2.0000 mg | INTRAMUSCULAR | Status: DC | PRN
  Administered 2023-03-25 – 2023-03-26 (×3): 2 mg via INTRAVENOUS
  Filled 2023-03-25 (×3): qty 1

## 2023-03-25 MED ORDER — VANCOMYCIN HCL 2000 MG/400ML IV SOLN
2000.0000 mg | Freq: Once | INTRAVENOUS | Status: AC
  Administered 2023-03-25: 2000 mg via INTRAVENOUS
  Filled 2023-03-25: qty 400

## 2023-03-25 MED ORDER — MORPHINE BOLUS VIA INFUSION
5.0000 mg | INTRAVENOUS | Status: DC | PRN
  Administered 2023-03-25 – 2023-03-26 (×5): 5 mg via INTRAVENOUS

## 2023-03-25 MED ORDER — POLYVINYL ALCOHOL 1.4 % OP SOLN: 1.0000 [drp] | Freq: Four times a day (QID) | OPHTHALMIC | Status: DC | PRN

## 2023-03-25 MED ORDER — ENOXAPARIN SODIUM 60 MG/0.6ML IJ SOSY
50.0000 mg | PREFILLED_SYRINGE | INTRAMUSCULAR | Status: DC
  Filled 2023-03-25: qty 0.6

## 2023-03-25 MED ORDER — POLYETHYLENE GLYCOL 3350 17 G PO PACK: 17.0000 g | PACK | Freq: Every day | ORAL | Status: DC | PRN

## 2023-03-25 NOTE — Plan of Care (Signed)
  Problem: Health Behavior/Discharge Planning: Goal: Goals will be collaboratively established with patient/family Outcome: Progressing   Problem: Nutrition: Goal: Adequate nutrition will be maintained Outcome: Progressing   Problem: Elimination: Goal: Will not experience complications related to bowel motility Outcome: Progressing   Problem: Safety: Goal: Ability to remain free from injury will improve Outcome: Progressing   Problem: Skin Integrity: Goal: Risk for impaired skin integrity will decrease Outcome: Progressing   Problem: Education: Goal: Knowledge of disease or condition will improve Outcome: Not Progressing   Problem: Ischemic Stroke/TIA Tissue Perfusion: Goal: Complications of ischemic stroke/TIA will be minimized Outcome: Not Progressing   Problem: Coping: Goal: Will verbalize positive feelings about self Outcome: Not Progressing Goal: Will identify appropriate support needs Outcome: Not Progressing   Problem: Health Behavior/Discharge Planning: Goal: Ability to manage health-related needs will improve Outcome: Not Progressing   Problem: Education: Goal: Knowledge of General Education information will improve Description: Including pain rating scale, medication(s)/side effects and non-pharmacologic comfort measures Outcome: Not Progressing   Problem: Activity: Goal: Risk for activity intolerance will decrease Outcome: Not Progressing   Problem: Pain Managment: Goal: General experience of comfort will improve and/or be controlled Outcome: Not Progressing   Problem: Activity: Goal: Ability to tolerate increased activity will improve Outcome: Not Progressing   Problem: Respiratory: Goal: Ability to maintain a clear airway and adequate ventilation will improve Outcome: Not Progressing   Problem: Role Relationship: Goal: Method of communication will improve Outcome: Not Progressing

## 2023-03-25 NOTE — Progress Notes (Signed)
 Patient extubated to comfort care per orders

## 2023-03-25 NOTE — Procedures (Signed)
 Patient Name: Marc Fischer  MRN: [id number]  Epilepsy Attending: Arlin MALVA Krebs  Referring Physician/Provider: Isadora Hose, MD  Date: 03/25/2023 Duration: 36.24 mins  Patient history: a 73 y.o. male from whom neurology was consulted for c/f seizure and stroke. EEG to evaluate for seizure  Level of alertness:  comatose  AEDs during EEG study: Propofol   Technical aspects: This EEG study was done with scalp electrodes positioned according to the 10-20 International system of electrode placement. Electrical activity was reviewed with band pass filter of 1-70Hz , sensitivity of 7 uV/mm, display speed of 45mm/sec with a 60Hz  notched filter applied as appropriate. EEG data were recorded continuously and digitally stored.  Video monitoring was available and reviewed as appropriate.  Description: EEG showed continuous generalized polymorphic sharply contoured 3 to 6 Hz theta-delta slowing. Hyperventilation and photic stimulation were not performed.     ABNORMALITY - Continuous slow, generalized  IMPRESSION: This study is suggestive of moderate to severe diffuse encephalopathy. No seizures or definite epileptiform discharges were seen throughout the recording.  Lark Runk O [place]

## 2023-03-25 NOTE — Progress Notes (Signed)
 Updated pts sister Arland Elks via telephone regarding pts condition and current plan of care.  All questions were answered.  Will continue to monitor and assess pt.  Lonell Moose, AGNP  Pulmonary/Critical Care Pager [phone] (please enter 7 digits) PCCM Consult Pager [phone] (please enter 7 digits)
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
## 2023-03-25 NOTE — IPAL (Signed)
  Interdisciplinary Goals of Care Family Meeting   Date carried out: 03/25/2023  Location of the meeting: Phone conference  Member's involved: Physician, Nurse Practitioner, and Family Member or next of kin  Durable Power of Attorney or acting medical decision maker: Sister Arland Elks.    Discussion: We discussed goals of care for [facility]. I called Mr. Heap sister and had a prolonged conversation over the phone in the presence of NP Nelson. On the phone were also Donna's friend, Yolande, and we also conferenced in his friend Wadie. Sister and Friend both reported that Mr. Behrend had explicitly stated to them that he would not want any aggressive medical interventions. He would not have wanted to be intubated, nor would he have wanted medical care given potential for him being dependent on others for his activities of daily living. Mr. Batta had repeatedly, according to family and friend, refused medical care. He had very clear wishes of not wanting to be in a wheel chair, be in a nursing facility, or be alive if that meant he would be debilitated. Discussed overall plan of care, with findings of stroke, respiratory failure, AKI, myocardial injury, and bleed. Patient's sister expressed wishes to transition goals of care to comfort measures only. She stated that Mr. Gearhart would not have wanted to be kept alive if it meant that there wasn't a chance he would be 100% independent.  Code status:   Code Status: Limited: Do not attempt resuscitation (DNR) -DNR-LIMITED -Do Not Intubate/DNI    Disposition: In-patient comfort care  Time spent for the meeting: 23 minutes    Belva November, MD  03/25/2023, 6:11 PM
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
## 2023-03-25 NOTE — Consult Note (Signed)
 Pharmacy Antibiotic Note  Marc Fischer is a 73 y.o. male with medical history including hypertension, hyperlipidemia, stroke, GERD, tobacco abuse, alcohol abuse, cocaine abuse, obesity admitted on 03/19/2023 with  unresponsiveness, stroke, aspiration pneumonia, splenic hemorrhage / subscapular splenic hematoma . Pharmacy has been consulted for vancomycin  dosing. Patient is also ordered Zosyn .  Plan:  Vancomycin  2 g IV x 1 followed by maintenance regimen of vancomycin  1.25 g IV q24h --Calculated AUC: 463, Cmin: 10.6 --Daily Scr per protocol --Levels at steady state or as clinically indicated   Height: 5' 10 (177.8 cm) Weight: 105.2 kg (231 lb 14.8 oz) IBW/kg (Calculated) : 73  Temp (24hrs), Avg:100.2 F (37.9 C), Min:99.3 F (37.4 C), Max:101.5 F (38.6 C)  Recent Labs  Lab 03/21/23 2103 03/22/23 0052 03/22/23 0527 03/23/23 0419 03/24/23 0348 03/24/23 1354 03/25/23 0357  WBC  --    < > 11.4* 13.2* 9.7 10.6* 9.5  CREATININE 1.40*  --  1.25* 1.82* 1.76*  --  1.22   < > = values in this interval not displayed.    Estimated Creatinine Clearance: 66.5 mL/min (by C-G formula based on SCr of 1.22 mg/dL).    Allergies  Allergen Reactions   Penicillins     Tolerated Unasyn  03/19/23    Antimicrobials this admission: Unasyn  1/30 >> 2/4 Doxycycline  2/2 x 1  Zosyn  2/5 >>  Vancomycin  2/5 >>   Dose adjustments this admission: N/A  Microbiology results: 1/30 BCx: NG 2/2 MRSA PCR: (-) 2/4: RVP (-) 2/5 Tracheal aspirate: pending  Thank you for allowing pharmacy to be a part of this patient's care.  Janna Oak B Kim Oki 03/25/2023 11:41 AM
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
## 2023-03-25 NOTE — Plan of Care (Signed)
  Problem: Nutrition: Goal: Adequate nutrition will be maintained 03/25/2023 0744 by Butler Grate D, RN Outcome: Progressing 03/25/2023 0740 by Butler Grate BIRCH, RN Outcome: Progressing   Problem: Elimination: Goal: Will not experience complications related to bowel motility Outcome: Progressing Goal: Will not experience complications related to urinary retention Outcome: Progressing   Problem: Safety: Goal: Ability to remain free from injury will improve Outcome: Progressing   Problem: Skin Integrity: Goal: Risk for impaired skin integrity will decrease Outcome: Progressing   Problem: Education: Goal: Knowledge of disease or condition will improve 03/25/2023 0744 by Butler Grate BIRCH, RN Outcome: Not Progressing 03/25/2023 0740 by Butler Grate BIRCH, RN Outcome: Not Progressing   Problem: Ischemic Stroke/TIA Tissue Perfusion: Goal: Complications of ischemic stroke/TIA will be minimized 03/25/2023 0744 by Butler Grate BIRCH, RN Outcome: Not Progressing 03/25/2023 0740 by Butler Grate BIRCH, RN Outcome: Not Progressing   Problem: Coping: Goal: Will verbalize positive feelings about self 03/25/2023 0744 by Butler Grate BIRCH, RN Outcome: Not Progressing 03/25/2023 0740 by Butler Grate BIRCH, RN Outcome: Not Progressing Goal: Will identify appropriate support needs 03/25/2023 0744 by Butler Grate BIRCH, RN Outcome: Not Progressing 03/25/2023 0740 by Butler Grate BIRCH, RN Outcome: Not Progressing   Problem: Health Behavior/Discharge Planning: Goal: Ability to manage health-related needs will improve 03/25/2023 0744 by Butler Grate BIRCH, RN Outcome: Not Progressing 03/25/2023 0740 by Butler Grate BIRCH, RN Outcome: Not Progressing Goal: Goals will be collaboratively established with patient/family 03/25/2023 0744 by Butler Grate BIRCH, RN Outcome: Not Progressing 03/25/2023 0740 by Butler Grate D, RN Outcome: Progressing   Problem: Self-Care: Goal: Ability to participate in  self-care as condition permits will improve Outcome: Not Progressing Goal: Verbalization of feelings and concerns over difficulty with self-care will improve Outcome: Not Progressing Goal: Ability to communicate needs accurately will improve Outcome: Not Progressing   Problem: Nutrition: Goal: Risk of aspiration will decrease Outcome: Not Progressing Goal: Dietary intake will improve Outcome: Not Progressing   Problem: Education: Goal: Knowledge of General Education information will improve Description: Including pain rating scale, medication(s)/side effects and non-pharmacologic comfort measures 03/25/2023 0744 by Butler Grate BIRCH, RN Outcome: Not Progressing 03/25/2023 0740 by Butler Grate BIRCH, RN Outcome: Not Progressing   Problem: Health Behavior/Discharge Planning: Goal: Ability to manage health-related needs will improve Outcome: Not Progressing   Problem: Clinical Measurements: Goal: Diagnostic test results will improve Outcome: Not Progressing   Problem: Activity: Goal: Risk for activity intolerance will decrease 03/25/2023 0744 by Butler Grate BIRCH, RN Outcome: Not Progressing 03/25/2023 0740 by Butler Grate BIRCH, RN Outcome: Not Progressing   Problem: Coping: Goal: Level of anxiety will decrease Outcome: Not Progressing   Problem: Pain Managment: Goal: General experience of comfort will improve and/or be controlled Outcome: Not Progressing   Problem: Activity: Goal: Ability to tolerate increased activity will improve 03/25/2023 0744 by Butler Grate BIRCH, RN Outcome: Not Progressing 03/25/2023 0740 by Butler Grate BIRCH, RN Outcome: Not Progressing   Problem: Respiratory: Goal: Ability to maintain a clear airway and adequate ventilation will improve 03/25/2023 0744 by Butler Grate BIRCH, RN Outcome: Not Progressing 03/25/2023 0740 by Butler Grate BIRCH, RN Outcome: Not Progressing   Problem: Role Relationship: Goal: Method of communication will  improve 03/25/2023 0744 by Butler Grate BIRCH, RN Outcome: Not Progressing 03/25/2023 0740 by Butler Grate BIRCH, RN Outcome: Not Progressing

## 2023-03-25 NOTE — Progress Notes (Signed)
 Eeg done
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
## 2023-03-25 NOTE — Progress Notes (Signed)
 Transported to MRI and back with no events.

## 2023-03-26 ENCOUNTER — Encounter: Payer: Self-pay | Admitting: Emergency Medicine

## 2023-03-27 LAB — CULTURE, RESPIRATORY W GRAM STAIN: Gram Stain: NONE SEEN

## 2023-03-30 LAB — CULTURE, BLOOD (ROUTINE X 2)
Culture: NO GROWTH
Culture: NO GROWTH
Special Requests: ADEQUATE
Special Requests: ADEQUATE

## 2023-04-18 NOTE — Progress Notes (Signed)
 Pt expired at 844

## 2023-04-18 NOTE — Plan of Care (Signed)
  Problem: Education: Goal: Knowledge of disease or condition will improve Outcome: Not Progressing Goal: Knowledge of secondary prevention will improve (MUST DOCUMENT ALL) Outcome: Not Progressing Goal: Knowledge of patient specific risk factors will improve (DELETE if not current risk factor) Outcome: Not Progressing   Problem: Ischemic Stroke/TIA Tissue Perfusion: Goal: Complications of ischemic stroke/TIA will be minimized Outcome: Not Progressing   Problem: Coping: Goal: Will verbalize positive feelings about self Outcome: Not Progressing Goal: Will identify appropriate support needs Outcome: Not Progressing   Problem: Health Behavior/Discharge Planning: Goal: Ability to manage health-related needs will improve Outcome: Not Progressing Goal: Goals will be collaboratively established with patient/family Outcome: Not Progressing   Problem: Self-Care: Goal: Ability to participate in self-care as condition permits will improve Outcome: Not Progressing Goal: Verbalization of feelings and concerns over difficulty with self-care will improve Outcome: Not Progressing Goal: Ability to communicate needs accurately will improve Outcome: Not Progressing   Problem: Nutrition: Goal: Risk of aspiration will decrease Outcome: Not Progressing Goal: Dietary intake will improve Outcome: Not Progressing   Problem: Education: Goal: Knowledge of General Education information will improve Description: Including pain rating scale, medication(s)/side effects and non-pharmacologic comfort measures Outcome: Not Progressing   Problem: Health Behavior/Discharge Planning: Goal: Ability to manage health-related needs will improve Outcome: Not Progressing   Problem: Clinical Measurements: Goal: Ability to maintain clinical measurements within normal limits will improve Outcome: Not Progressing Goal: Will remain free from infection Outcome: Not Progressing Goal: Diagnostic test results will  improve Outcome: Not Progressing Goal: Respiratory complications will improve Outcome: Not Progressing Goal: Cardiovascular complication will be avoided Outcome: Not Progressing   Problem: Activity: Goal: Risk for activity intolerance will decrease Outcome: Not Progressing   Problem: Nutrition: Goal: Adequate nutrition will be maintained Outcome: Not Progressing   Problem: Coping: Goal: Level of anxiety will decrease Outcome: Not Progressing   Problem: Elimination: Goal: Will not experience complications related to bowel motility Outcome: Not Progressing Goal: Will not experience complications related to urinary retention Outcome: Not Progressing   Problem: Pain Managment: Goal: General experience of comfort will improve and/or be controlled Outcome: Not Progressing   Problem: Safety: Goal: Ability to remain free from injury will improve Outcome: Not Progressing   Problem: Skin Integrity: Goal: Risk for impaired skin integrity will decrease Outcome: Not Progressing   Problem: Activity: Goal: Ability to tolerate increased activity will improve Outcome: Not Progressing   Problem: Respiratory: Goal: Ability to maintain a clear airway and adequate ventilation will improve Outcome: Not Progressing   Problem: Role Relationship: Goal: Method of communication will improve Outcome: Not Progressing

## 2023-04-18 NOTE — Death Summary Note (Addendum)
 DEATH SUMMARY   Patient Details  Name: Marc Fischer MRN: [id number] DOB: [birth year]  Admission/Discharge Information   Admit Date:  04-09-2023  Date of Death: Date of Death: 2023/04/16  Time of Death: Time of Death: 0844  Length of Stay: 7  Referring Physician: Pcp, No   Reason(s) for Hospitalization  Hemorrhagic shock and Fall  Diagnoses  Preliminary cause of death:  Secondary Diagnoses (including complications and co-morbidities):  Principal Problem:   Unresponsiveness Active Problems:   HTN (hypertension)   HLD (hyperlipidemia)   Stroke (HCC)   Tobacco abuse   Fall at home, initial encounter   Hypokalemia   AKI (acute kidney injury) (HCC)   Metabolic acidosis   Obesity (BMI 30-39.9)   Abdominal distension   Aspiration pneumonia (HCC)   Alcohol abuse   Splenic hemorrhage_subscapular splenic hematoma   Hemorrhagic shock (HCC)   Alcohol use   Acute respiratory failure with hypoxia (HCC)   Seizure [facility])   Brief Hospital Course (including significant findings, care, treatment, and services provided and events leading to death)  Marc Fischer is a 73 y.o. year old male who has a longstanding history of alcohol and polysubstance use who presented to the hospital following a fall. He'd been having right sided weakness and altered mental status prior to his fall. Patient was noted to have perisplenic and perihepatic hematomas with hemorrhagic shock for which she was resuscitated.  CTA of the abdomen did not show any acute bleed.  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  Given severe encephalopathy and shock, he was intubated and mechanically ventilated and admitted to the ICU.  Patient's mental status was depressed with workup including CT of the brain as well as MRI of the brain showing multiple ischemic infarcts.  Patient is without descendent or spouse and only family member noted was his sister  Arland.  Following goals of care discussions with the sister, she opted to withdraw care and transition him to comfort measures only given his previously stated wishes.  Patient was compassionately extubated and he passed away shortly thereafter.  Medical examiner informed of the case.    Pertinent Labs and Studies  Significant Diagnostic Studies EEG adult Result Date: 03/25/2023 Shelton Arlin KIDD, MD     03/25/2023  4:09 PM Patient Name: Marc Fischer MRN: [id number] Epilepsy Attending: Arlin KIDD Shelton Referring Physician/Provider: Isadora Hose, MD Date: 03/25/2023 Duration: 36.24 mins Patient history: a 73 y.o. male from whom neurology was consulted for c/f seizure and stroke. EEG to evaluate for seizure Level of alertness:  comatose AEDs during EEG study: Propofol  Technical aspects: This EEG study was done with scalp electrodes positioned according to the 10-20 International system of electrode placement. Electrical activity was reviewed with band pass filter of 1-70Hz , sensitivity of 7 uV/mm, display speed of 70mm/sec with a 60Hz  notched filter applied as appropriate. EEG data were recorded continuously and digitally stored.  Video monitoring was available and reviewed as appropriate. Description: EEG showed continuous generalized polymorphic sharply contoured 3 to 6 Hz theta-delta slowing. Hyperventilation and photic stimulation were not performed.   ABNORMALITY - Continuous slow, generalized IMPRESSION: This study is suggestive of moderate to severe diffuse encephalopathy. No seizures or definite epileptiform discharges were seen throughout the recording. Arlin KIDD Shelton   MR BRAIN WO CONTRAST Result Date: 03/25/2023 CLINICAL DATA:  Left-sided weakness EXAM: MRI HEAD WITHOUT CONTRAST TECHNIQUE: Multiplanar, multiecho pulse sequences of the brain and surrounding structures were obtained  without intravenous contrast. COMPARISON:  03/19/2023 FINDINGS: Brain: Numerous bilateral foci of abnormal diffusion  restriction, most of which are new since 03/19/23. The largest new lesion is in the left cerebellum. The right basal ganglia infarct site is unchanged. No acute or chronic hemorrhage. There is multifocal hyperintense T2-weighted signal within the white matter. Parenchymal volume and CSF spaces are normal. Left middle cranial fossa arachnoid cyst the midline structures are normal. Vascular: Normal flow voids. Skull and upper cervical spine: Normal calvarium and skull base. Visualized upper cervical spine and soft tissues are normal. Sinuses/Orbits:No paranasal sinus fluid levels or advanced mucosal thickening. No mastoid or middle ear effusion. Normal orbits. IMPRESSION: 1. Numerous bilateral foci of acute ischemia, most of which are new since 03/19/23. No hemorrhage or mass effect. 2. Unchanged right basal ganglia infarct site. Electronically Signed   By: Franky Stanford M.D.   On: 03/25/2023 03:23   CT HEAD WO CONTRAST ( ) Result Date: 03/24/2023 CLINICAL DATA:  Mental status change of unknown cause EXAM: CT HEAD WITHOUT CONTRAST TECHNIQUE: Contiguous axial images were obtained from the base of the skull through the vertex without intravenous contrast. RADIATION DOSE REDUCTION: This exam was performed according to the departmental dose-optimization program which includes automated exposure control, adjustment of the mA and/or kV according to patient size and/or use of iterative reconstruction technique. COMPARISON:  03/21/2023.  MRI 03/19/2023 FINDINGS: Brain: Newly seen acute infarction at the inferior cerebellum on the left. No hemorrhage or mass effect. Subacute infarction in the right basal ganglia and radiating white matter tracts appears similar. No evidence of extension or hemorrhagic transformation. Chronic small-vessel ischemic changes of the white matter otherwise as seen previously. No hydrocephalus or extra-axial collection. Vascular: There is atherosclerotic calcification of the major vessels at the  base of the brain. Skull: Negative Sinuses/Orbits: Clear/normal Other: None IMPRESSION: 1. Newly seen acute infarction at the inferior cerebellum on the left. No hemorrhage or mass effect. 2. Subacute infarction in the right basal ganglia and radiating white matter tracts appears similar. No evidence of extension or hemorrhagic transformation. 3. Chronic small-vessel ischemic changes of the white matter otherwise as seen previously. Electronically Signed   By: Oneil Officer M.D.   On: 03/24/2023 20:32   DG Chest Port 1 View Result Date: 03/23/2023 CLINICAL DATA:  History of endotracheal tube EXAM: PORTABLE CHEST 1 VIEW COMPARISON:  Chest x-ray 03/22/2023 FINDINGS: The endotracheal tube tip is 4 cm above the carina. The enteric tube extends below the diaphragm. Heart is enlarged, unchanged. There is left basilar patchy opacity and likely small left pleural effusion. IMPRESSION: 1. Endotracheal tube tip 4 cm above the carina. 2. Left basilar patchy opacity and likely small left pleural effusion. Electronically Signed   By: Greig Pique M.D.   On: 03/23/2023 15:09   CT ANGIO ABDOMEN W &/OR WO CONTRAST Result Date: 03/22/2023 CLINICAL DATA:  Splenic hematoma.  Evaluate for active bleeding. EXAM: CT ANGIOGRAPHY ABDOMEN TECHNIQUE: Multidetector CT imaging of the abdomen was performed using the standard protocol during bolus administration of intravenous contrast. Multiplanar reconstructed images and MIPs were obtained and reviewed to evaluate the vascular anatomy. RADIATION DOSE REDUCTION: This exam was performed according to the departmental dose-optimization program which includes automated exposure control, adjustment of the mA and/or kV according to patient size and/or use of iterative reconstruction technique. CONTRAST:  OMNIPAQUE  IOHEXOL  350 MG/ML SOLN COMPARISON:  03/21/2023 FINDINGS: VASCULAR Aorta: Normal caliber aorta without aneurysm, dissection, vasculitis or significant stenosis. Aortic  atherosclerotic calcifications. Celiac: Patent  without evidence of aneurysm, dissection, vasculitis or significant stenosis. Splenic artery is patent without signs of dissection or occlusion. No hyperdense focus of contrast extravasation to indicate site of active bleeding. SMA: Patent without evidence of aneurysm, dissection, vasculitis or significant stenosis. Renals: The left renal artery is patent. There is a nonocclusive, focal filling defect within the proximal right renal artery measuring 6 mm, image 89/17. This results in a short segment of high-grade stenosis with flow identified distally. No signs of right renal infarction. IMA: Patent without evidence of aneurysm, dissection, vasculitis or significant stenosis. Inflow: Patent without evidence of aneurysm, dissection, vasculitis or significant stenosis. Veins: No obvious venous abnormality within the limitations of this arterial phase study. Review of the MIP images confirms the above findings. NON-VASCULAR Lower chest: Small bilateral pleural effusions and bilateral lower lobe atelectasis/consolidation. Hepatobiliary: Right-sided subcapsular hematoma is again noted. This appears similar in volume to the previous exam. Over the anterolateral right lobe this measures 1.4 cm in thickness, image 20/3. Previously 1.3 cm. Over the inferolateral margin this measures 1 cm in thickness, image 36/3. Previously 0.9 cm. Hematoma extending along the inferior medial margin of the liver measures 1.2 cm in thickness. Previously 0.9 cm. Contrast material in the gallbladder is again noted compatible with vicarious excretion. Pancreas: Unremarkable. No pancreatic ductal dilatation or surrounding inflammatory changes. Spleen: Are again I zinc splenic hematoma is again seen. This measures 5 cm in thickness over the lateral margin of the spleen, image 73/4. Previously this measured the same. Measured off the coronal images the largest portion of the hematoma measures 13.9 x  8.1 cm, image 75/6. Previously 14.1 x 7.7 cm. Adrenals/Urinary Tract: Normal adrenal glands. No nephrolithiasis, hydronephrosis or suspicious mass. Stomach/Bowel: There is a enteric tube with tip in the body of stomach. No dilated loops of large or small bowel identified. Lymphatic: No adenopathy identified. Other: No discrete fluid collections.  No signs of pneumoperitoneum. Musculoskeletal: There is an acute fracture involving the lateral aspect of the left tenth rib at the level of the splenic hematoma, image 78/14. Nondisplaced fracture involving the anterior aspect of the left 6 rib also noted. Nondisplaced right twelfth rib fracture is also suspected but cannot be confirmed with a high degree of certainty. IMPRESSION: 1. No signs of active bleeding within the spleen. The splenic artery is patent without signs of dissection or occlusion. 2. There is a nonocclusive, focal filling defect within the proximal right renal artery measuring 6 mm. This results in a short segment of high-grade stenosis with flow identified distally. No signs of right renal infarction. 3. Stable size of splenic hematoma. 4. Stable size of right-sided subcapsular hematoma. 5. Small bilateral pleural effusions and bilateral lower lobe atelectasis/consolidation. 6. Acute fracture involving the lateral aspect of the left tenth rib at the level of the splenic hematoma. Nondisplaced fracture involving the anterior aspect of the left 6 rib also noted. Nondisplaced right twelfth rib fracture is also suspected but cannot be confirmed with a high degree of certainty. 7.  Aortic Atherosclerosis (ICD10-I70.0). 8. These results were called by telephone at the time of interpretation on 03/22/2023 at 6:35 pm to provider [facility] , who verbally acknowledged these results. Electronically Signed   By: Waddell Calk M.D.   On: 03/22/2023 18:36   DG Chest Port 1 View Result Date: 03/22/2023 CLINICAL DATA:  Endotracheal tube placement. EXAM: PORTABLE  CHEST 1 VIEW COMPARISON:  Same day. FINDINGS: Endotracheal and nasogastric tubes are in grossly good position. Stable left pleural  effusion with associated atelectasis or infiltrate. IMPRESSION: Endotracheal and nasogastric tubes are in grossly good position. Electronically Signed   By: Lynwood Landy Raddle M.D.   On: 03/22/2023 13:24   DG Chest Port 1 View Result Date: 03/22/2023 CLINICAL DATA:  Endotracheal tube placement. EXAM: PORTABLE CHEST 1 VIEW COMPARISON:  03/22/2023 and CT chest 03/21/2023. FINDINGS: Endotracheal tube terminates approximately 1.4 cm above the carina. Nasogastric tube is followed into the stomach with the tip projecting beyond the inferior margin of the image. Heart is enlarged. Lungs are low in volume. Collapse/consolidation in the left lung base. There may be a small left pleural effusion. No low right basilar subsegmental atelectasis. IMPRESSION: 1. Endotracheal tube is low lying. Retracting approximately 3-4 cm should better position the tip above the carina. These results will be called to the ordering clinician or representative by the Radiologist Assistant, and communication documented in the PACS or [employer]. 2. Left basilar collapse/consolidation may be due to atelectasis or pneumonia. 3. Probable small left pleural effusion. Electronically Signed   By: Newell Eke M.D.   On: 03/22/2023 12:57   DG Abd 1 View Result Date: 03/22/2023 CLINICAL DATA:  Endotracheal tube and nasogastric tube placement. EXAM: ABDOMEN - 1 VIEW COMPARISON:  03/19/2023. FINDINGS: Nasogastric tube terminates in the stomach, likely within the antrum. IMPRESSION: Nasogastric tube terminates in the stomach. Electronically Signed   By: Newell Eke M.D.   On: 03/22/2023 12:55   DG Chest 1 View Result Date: 03/22/2023 CLINICAL DATA:  73 year old male with history of shortness of breath. Oxygen desaturations. EXAM: CHEST  1 VIEW COMPARISON:  Chest x-ray 03/19/2023. FINDINGS: Lung volumes are low.  Increasing diffuse interstitial prominence, widespread peribronchial cuffing and patchy multifocal airspace disease throughout the lungs bilaterally, most confluent in the left base. Probable small left pleural effusion. No pneumothorax. No evidence of pulmonary edema. Heart size is borderline enlarged. The patient is rotated to the left on today's exam, resulting in distortion of the mediastinal contours and reduced diagnostic sensitivity and specificity for mediastinal pathology. Atherosclerotic calcifications are noted in the thoracic aorta. IMPRESSION: 1. The appearance of the chest is concerning for multilobar bilateral pneumonia, most confluent in the left lung base, with probable small left pleural effusion. 2. Aortic atherosclerosis. Electronically Signed   By: Toribio Aye M.D.   On: 03/22/2023 07:56   CT CHEST ABDOMEN PELVIS WO CONTRAST Result Date: 03/21/2023 CLINICAL DATA:  Blunt abdominal trauma. Follow-up splenic hematoma. Drop in hemoglobin. Left-sided weakness. EXAM: CT CHEST, ABDOMEN AND PELVIS WITHOUT CONTRAST TECHNIQUE: Multidetector CT imaging of the chest, abdomen and pelvis was performed following the standard protocol without IV contrast. RADIATION DOSE REDUCTION: This exam was performed according to the departmental dose-optimization program which includes automated exposure control, adjustment of the mA and/or kV according to patient size and/or use of iterative reconstruction technique. COMPARISON:  Chest radiograph 03/19/2023. CT abdomen and pelvis 03/19/2023. FINDINGS: CT CHEST FINDINGS Cardiovascular: Mild cardiac enlargement. No pericardial effusions. Normal caliber thoracic aorta. Calcification of the aorta and coronary arteries. Mediastinum/Nodes: Esophagus is decompressed. No significant lymphadenopathy. Thyroid gland is unremarkable. Lungs/Pleura: Motion artifact limits examination. There is atelectasis or consolidation in both lower lungs. No visible pleural effusion or  pneumothorax. Musculoskeletal: Acute appearing fractures of the anterolateral left third through tenth ribs. Sternum and spine appear intact. CT ABDOMEN PELVIS FINDINGS Hepatobiliary: Subcapsular hematoma along the right lobe of the liver measuring 1.7 cm depth. This is progressing since the previous study. Increased density in the gallbladder likely representing  vicarious excretion of contrast material. No bile duct dilatation. Pancreas: Unremarkable. No pancreatic ductal dilatation or surrounding inflammatory changes. Spleen: Organizing splenic hematoma measuring 4.2 cm depth, similar to prior study. Adrenals/Urinary Tract: Adrenal glands are unremarkable. Kidneys are normal, without renal calculi, focal lesion, or hydronephrosis. Bladder is unremarkable. Stomach/Bowel: Stomach, small bowel, and colon are not abnormally distended. No wall thickening or inflammatory changes. Vascular/Lymphatic: Aortic atherosclerosis. No enlarged abdominal or pelvic lymph nodes. Reproductive: Prostate is unremarkable. Other: Free fluid in the abdomen and pelvis with increased density consistent with hemorrhagic ascites. This appears mildly increased since the prior study. No free intra-abdominal air. Musculoskeletal: Degenerative changes in the spine. No acute bony abnormalities. IMPRESSION: 1. Multiple acute left rib fractures. 2. Atelectasis or consolidation in both lower lungs, progressing since prior study. 3. Progressing subcapsular hematoma in the liver. 4. Organizing splenic subcapsular hematoma similar to prior study. 5. Increased density in the gallbladder consistent with vicarious excretion of contrast material. 6. Hemoperitoneum, mildly increased since prior study. 7. Aortic atherosclerosis. These results were called by telephone at the time of interpretation on 03/21/2023 at 11:18 pm to provider [facility] , who verbally acknowledged these results. Electronically Signed   By: Elsie Gravely M.D.   On: 03/21/2023  23:24   CT HEAD WO CONTRAST ( ) Result Date: 03/21/2023 CLINICAL DATA:  Follow-up stroke.  Left-sided weakness. EXAM: CT HEAD WITHOUT CONTRAST TECHNIQUE: Contiguous axial images were obtained from the base of the skull through the vertex without intravenous contrast. RADIATION DOSE REDUCTION: This exam was performed according to the departmental dose-optimization program which includes automated exposure control, adjustment of the mA and/or kV according to patient size and/or use of iterative reconstruction technique. COMPARISON:  MRI brain 03/19/2023.  CT head 03/19/2023 FINDINGS: Brain: Diffuse cerebral atrophy. Mild ventricular dilatation consistent with central atrophy. Low-attenuation changes in the deep white matter consistent with small vessel ischemia. Focal area of low-attenuation in the right thalamus and basal ganglia corresponding to acute infarct seen on prior MRI. Similar appearance to previous study. No developing mass effect or intracranial hemorrhage. Vascular: No hyperdense vessel or unexpected calcification. Skull: Normal. Negative for fracture or focal lesion. Sinuses/Orbits: No acute finding. Other: None. IMPRESSION: Focal low-attenuation in the right basal ganglia and thalamus corresponding to known acute infarct. No significant change since prior study. No developing mass effect or intracranial hemorrhage. Chronic atrophy and small vessel ischemic changes. Electronically Signed   By: Elsie Gravely M.D.   On: 03/21/2023 23:10   ECHOCARDIOGRAM COMPLETE Result Date: 03/21/2023    ECHOCARDIOGRAM REPORT   Patient Name:   GENESIS PAGET Date of Exam: 03/21/2023 Medical Rec #:  [id number]      Height:       70.0 in Accession #:    [id number]     Weight:       212.0 lb Date of Birth:  10-Sep-1950     BSA:          2.140 m Patient Age:    72 years       BP:           166/88 mmHg Patient Gender: M              HR:           93 bpm. Exam Location:  [facility] Procedure: 2D Echo Indications:     Stroke  I63.9  History:         Patient has no prior history of Echocardiogram examinations.  Sonographer:  Bernice Rubinstein RDCS Referring Phys:  [id number] XILIN NIU Diagnosing Phys: Shelda Bruckner MD  Sonographer Comments: Technically difficult study due to poor echo windows and suboptimal subcostal window. Image acquisition challenging due to patient behavioral factors., Image acquisition challenging due to uncooperative patient and Image acquisition  challenging due to respiratory motion. IMPRESSIONS  1. Left ventricular ejection fraction, by estimation, is 55 to 60%. The left ventricle has normal function. The left ventricle has no regional wall motion abnormalities. Left ventricular diastolic parameters are indeterminate.  2. Right ventricular systolic function is normal. The right ventricular size is normal.  3. Left atrial size was mildly dilated.  4. The mitral valve is normal in structure. Trivial mitral valve regurgitation. No evidence of mitral stenosis.  5. The aortic valve has an indeterminant number of cusps. There is mild calcification of the aortic valve. There is mild thickening of the aortic valve. Aortic valve regurgitation is not visualized. Aortic valve sclerosis/calcification is present, without any evidence of aortic stenosis.  6. Aortic dilatation noted. There is borderline dilatation of the ascending aorta, measuring 39 mm.  7. The inferior vena cava is normal in size with greater than 50% respiratory variability, suggesting right atrial pressure of 3 mmHg. Comparison(s): No prior Echocardiogram. Conclusion(s)/Recommendation(s): Normal biventricular function without evidence of hemodynamically significant valvular heart disease. No intracardiac source of embolism detected on this transthoracic study. Consider a transesophageal echocardiogram to exclude cardiac source of embolism if clinically indicated. FINDINGS  Left Ventricle: Left ventricular ejection fraction, by estimation, is 55 to 60%. The  left ventricle has normal function. The left ventricle has no regional wall motion abnormalities. The left ventricular internal cavity size was normal in size. There is  no left ventricular hypertrophy. Left ventricular diastolic parameters are indeterminate. Right Ventricle: The right ventricular size is normal. No increase in right ventricular wall thickness. Right ventricular systolic function is normal. Left Atrium: Left atrial size was mildly dilated. Right Atrium: Right atrial size was normal in size. Pericardium: There is no evidence of pericardial effusion. Mitral Valve: The mitral valve is normal in structure. Trivial mitral valve regurgitation. No evidence of mitral valve stenosis. Tricuspid Valve: The tricuspid valve is normal in structure. Tricuspid valve regurgitation is trivial. No evidence of tricuspid stenosis. Aortic Valve: The aortic valve has an indeterminant number of cusps. There is mild calcification of the aortic valve. There is mild thickening of the aortic valve. Aortic valve regurgitation is not visualized. Aortic valve sclerosis/calcification is present, without any evidence of aortic stenosis. Aortic valve peak gradient measures 9.6 mmHg. Pulmonic Valve: The pulmonic valve was not well visualized. Pulmonic valve regurgitation is not visualized. No evidence of pulmonic stenosis. Aorta: Aortic dilatation noted. There is borderline dilatation of the ascending aorta, measuring 39 mm. Venous: The inferior vena cava is normal in size with greater than 50% respiratory variability, suggesting right atrial pressure of 3 mmHg. IAS/Shunts: The atrial septum is grossly normal.  LEFT VENTRICLE PLAX 2D LVIDd:         4.90 cm Diastology LVIDs:         3.50 cm LV e' medial:    7.96 cm/s LV PW:         1.00 cm LV E/e' medial:  9.9 LV IVS:        1.10 cm LV e' lateral:   6.47 cm/s                        LV E/e' lateral: 12.2  RIGHT VENTRICLE RV Basal diam:  2.90 cm RV S prime:     17.90 cm/s TAPSE  (M-mode): 2.3 cm LEFT ATRIUM           Index        RIGHT ATRIUM           Index LA diam:      4.10 cm 1.92 cm/m   RA Area:     14.20 cm LA Vol (A2C): 85.7 ml 40.05 ml/m  RA Volume:   36.10 ml  16.87 ml/m LA Vol (A4C): 37.2 ml 17.39 ml/m  AORTIC VALVE              PULMONIC VALVE AV Vmax:      155.00 cm/s PV Vmax:        1.38 m/s AV Peak Grad: 9.6 mmHg    PV Peak grad:   7.6 mmHg LVOT Vmax:    114.00 cm/s RVOT Peak grad: 3 mmHg LVOT Vmean:   76.900 cm/s LVOT VTI:     0.188 m  AORTA Ao Asc diam: 3.90 cm MITRAL VALVE MV Area (PHT): 4.41 cm    SHUNTS MV Decel Time: 172 msec    Systemic VTI: 0.19 m MV E velocity: 78.90 cm/s MV A velocity: 92.20 cm/s MV E/A ratio: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  WBC 9.7 10.6* 9.5 9.1     Raynesha Tiedt 03/29/2023, 9:32 AM

## 2023-04-18 NOTE — Progress Notes (Addendum)
 Patients belongings, in a red bag (Shoes, Belt, Clothes) were sent with patient to the morgue with a toe tag on the bag

## 2023-04-18 NOTE — Progress Notes (Signed)
 Contacted medical examiner Marc Fischer via telephone to determine if pt would be considered and medical examiner case.  Ms Fischer stated he is a adult nurse case and she will evaluate pt once he is transferred to the morgue. Ok to remove the ETT.   Marc Fischer, AGNP  Pulmonary/Critical Care Pager [phone] (please enter 7 [phone]) PCCM Consult Pager [phone] (please enter 7 digits)

## 2023-04-18 NOTE — Progress Notes (Signed)
 Encounter opened in error

## 2023-04-18 DEATH — deceased
